# Patient Record
Sex: Male | Born: 1965 | Race: Black or African American | Hispanic: No | Marital: Married | Smoking: Never smoker
Health system: Southern US, Community
[De-identification: ages and names within clinical notes are randomized; demographics above are authoritative.]

## PROBLEM LIST (undated history)

## (undated) DIAGNOSIS — J45909 Unspecified asthma, uncomplicated: Secondary | ICD-10-CM

## (undated) DIAGNOSIS — Z800 Family history of malignant neoplasm of digestive organs: Secondary | ICD-10-CM

## (undated) DIAGNOSIS — M5432 Sciatica, left side: Secondary | ICD-10-CM

## (undated) DIAGNOSIS — K219 Gastro-esophageal reflux disease without esophagitis: Secondary | ICD-10-CM

## (undated) HISTORY — DX: Gastro-esophageal reflux disease without esophagitis: K21.9

## (undated) HISTORY — DX: Unspecified asthma, uncomplicated: J45.909

## (undated) HISTORY — DX: Family history of malignant neoplasm of digestive organs: Z80.0

---

## 2017-05-07 ENCOUNTER — Emergency Department: Payer: BLUE CROSS/BLUE SHIELD

## 2017-05-07 ENCOUNTER — Encounter: Payer: Self-pay | Admitting: Emergency Medicine

## 2017-05-07 ENCOUNTER — Emergency Department
Admission: EM | Admit: 2017-05-07 | Discharge: 2017-05-07 | Disposition: A | Discharge status: Home / Self Care | Payer: BLUE CROSS/BLUE SHIELD | Attending: Emergency Medicine | Admitting: Emergency Medicine

## 2017-05-07 DIAGNOSIS — R110 Nausea: Secondary | ICD-10-CM | POA: Insufficient documentation

## 2017-05-07 DIAGNOSIS — M542 Cervicalgia: Secondary | ICD-10-CM | POA: Insufficient documentation

## 2017-05-07 DIAGNOSIS — R51 Headache: Secondary | ICD-10-CM | POA: Diagnosis present

## 2017-05-07 DIAGNOSIS — G44209 Tension-type headache, unspecified, not intractable: Secondary | ICD-10-CM | POA: Diagnosis not present

## 2017-05-07 LAB — COMPREHENSIVE METABOLIC PANEL
ALT: 12 U/L — ABNORMAL LOW (ref 17–63)
AST: 17 U/L (ref 15–41)
Albumin: 3.6 g/dL (ref 3.5–5.0)
Alkaline Phosphatase: 54 U/L (ref 38–126)
Anion gap: 6 (ref 5–15)
BUN: 8 mg/dL (ref 6–20)
CO2: 26 mmol/L (ref 22–32)
Calcium: 8.6 mg/dL — ABNORMAL LOW (ref 8.9–10.3)
Chloride: 104 mmol/L (ref 101–111)
Creatinine, Ser: 0.79 mg/dL (ref 0.61–1.24)
GFR calc Af Amer: >60 mL/min (ref >60)
GFR calc non Af Amer: >60 mL/min (ref >60)
Glucose, Bld: 115 mg/dL — ABNORMAL HIGH (ref 65–99)
Potassium: 3.9 mmol/L (ref 3.5–5.1)
Sodium: 136 mmol/L (ref 135–145)
Total Bilirubin: 0.9 mg/dL (ref 0.3–1.2)
Total Protein: 7.9 g/dL (ref 6.5–8.1)

## 2017-05-07 LAB — URINALYSIS, COMPLETE (UACMP) WITH MICROSCOPIC
Bacteria, UA: NONE SEEN
Bilirubin Urine: NEGATIVE
Glucose, UA: NEGATIVE mg/dL
Hgb urine dipstick: NEGATIVE
Ketones, ur: NEGATIVE mg/dL
Leukocytes, UA: NEGATIVE
Nitrite: NEGATIVE
Protein, ur: NEGATIVE mg/dL
Specific Gravity, Urine: 1.019 (ref 1.005–1.030)
Squamous Epithelial / LPF: NONE SEEN
pH: 6 (ref 5.0–8.0)

## 2017-05-07 LAB — CBC WITH DIFFERENTIAL/PLATELET
Basophils Absolute: 0 10*3/uL (ref 0–0.1)
Basophils Relative: 1 %
Eosinophils Absolute: 0 10*3/uL (ref 0–0.7)
Eosinophils Relative: 0 %
HCT: 39.5 % — ABNORMAL LOW (ref 40.0–52.0)
Hemoglobin: 13.3 g/dL (ref 13.0–18.0)
Lymphocytes Relative: 34 %
Lymphs Abs: 1.9 10*3/uL (ref 1.0–3.6)
MCH: 29.9 pg (ref 26.0–34.0)
MCHC: 33.6 g/dL (ref 32.0–36.0)
MCV: 89.1 fL (ref 80.0–100.0)
Monocytes Absolute: 0.4 10*3/uL (ref 0.2–1.0)
Monocytes Relative: 7 %
Neutro Abs: 3.2 10*3/uL (ref 1.4–6.5)
Neutrophils Relative %: 58 %
Platelets: 191 10*3/uL (ref 150–440)
RBC: 4.43 MIL/uL (ref 4.40–5.90)
RDW: 13.6 % (ref 11.5–14.5)
WBC: 5.6 10*3/uL (ref 3.8–10.6)

## 2017-05-07 LAB — TROPONIN I: Troponin I: <0.03 ng/mL (ref <0.03)

## 2017-05-07 MED ORDER — SODIUM CHLORIDE 0.9 % IV BOLUS (SEPSIS): 1000.0000 mL | Freq: Once | INTRAVENOUS | Status: DC

## 2017-05-07 MED ORDER — LORAZEPAM 2 MG/ML IJ SOLN
1.0000 mg | Freq: Once | INTRAMUSCULAR | Status: AC
  Administered 2017-05-07: 1 mg via INTRAMUSCULAR

## 2017-05-07 MED ORDER — PROMETHAZINE HCL 25 MG/ML IJ SOLN
25.0000 mg | INTRAMUSCULAR | Status: AC
  Administered 2017-05-07: 25 mg via INTRAMUSCULAR
  Filled 2017-05-07: qty 1

## 2017-05-07 MED ORDER — LORAZEPAM 2 MG/ML IJ SOLN
INTRAMUSCULAR | Status: AC
  Administered 2017-05-07: 1 mg via INTRAMUSCULAR
  Filled 2017-05-07: qty 1

## 2017-05-07 MED ORDER — CYCLOBENZAPRINE HCL 10 MG PO TABS: 10.0000 mg | ORAL_TABLET | Freq: Three times a day (TID) | ORAL | 0 refills | Status: DC | PRN

## 2017-05-07 MED ORDER — DEXAMETHASONE 10 MG/ML FOR PEDIATRIC ORAL USE
8.0000 mg | Freq: Once | INTRAMUSCULAR | Status: AC
  Administered 2017-05-07: 8 mg via ORAL
  Filled 2017-05-07: qty 0.8

## 2017-05-07 MED ORDER — DEXAMETHASONE SODIUM PHOSPHATE 10 MG/ML IJ SOLN
8.0000 mg | Freq: Once | INTRAMUSCULAR | Status: DC
  Filled 2017-05-07: qty 1

## 2017-05-07 MED ORDER — PROMETHAZINE HCL 25 MG PO TABS: 25.0000 mg | ORAL_TABLET | Freq: Three times a day (TID) | ORAL | 0 refills | Status: DC | PRN

## 2017-05-07 MED ORDER — METOCLOPRAMIDE HCL 5 MG/ML IJ SOLN
10.0000 mg | Freq: Once | INTRAMUSCULAR | Status: DC
  Filled 2017-05-07: qty 2

## 2017-05-07 MED ORDER — IOPAMIDOL (ISOVUE-370) INJECTION 76%: 75.0000 mL | Freq: Once | INTRAVENOUS | Status: DC | PRN

## 2017-05-07 MED ORDER — ACETAMINOPHEN 500 MG PO TABS
1000.0000 mg | ORAL_TABLET | ORAL | Status: AC
  Administered 2017-05-07: 1000 mg via ORAL
  Filled 2017-05-07: qty 2

## 2017-05-07 NOTE — ED Notes (Addendum)
FIRST NURSE NOTE: Pt arrived via POV with c/o headache neck pain and nausea since yesterday.  Pt offered wheelchair, but declined. Pt is alert and oriented at front desk. Ambulatory without difficulty.

## 2017-05-07 NOTE — ED Provider Notes (Signed)
[facility] Emergency Department Provider Note   ____________________________________________   First MD Initiated Contact with Patient 05/07/17 1123     (approximate)  I have reviewed the triage vital signs and the nursing notes.   HISTORY  Chief Complaint Headache and Neck Pain   HPI Krishav Mamone is a 51 y.o. male reports that on Thursday evening he began experiencing mild headache. Presents seriously slightly worse over the right side of the back of the head and frontal region. Not throbbing, but describes a pressure-like feeling.  Over the last 2 days and headache seems to be steadily worsening. He reports that is been taking Profen yesterday with minimal relief. He has not had any fevers, no chills, no body aches. Does not work with bright light. Not worse by loud noise. Not associated with any vision changes.  Yesterday and this morning and reports he is now having pain from the headache that seems to radiate down into the base of the right side of the skull and onto the right side of the neck. Reports he can move his head up and down without difficulty, but when he turns his head side to side. His pain in the muscles on the right side of the neck.  No trauma or injury. Does not smoke. No history of cerebral aneurysm in the family. No known sick contacts.  No chest pain. No trouble breathing. No numbness or weakness in the arms or legs. No facial droop. No trouble speaking.  History reviewed. No pertinent past medical history.  There are no active problems to display for this patient.   History reviewed. No pertinent surgical history.  Prior to Admission medications   Medication Sig Start Date End Date Taking? Authorizing Provider  OVER THE COUNTER MEDICATION    Yes [provider]  cyclobenzaprine (FLEXERIL) 10 MG tablet Take 1 tablet (10 mg total) by mouth 3 (three) times daily as needed for muscle spasms. 05/07/17   Sharyn Creamer, MD    promethazine (PHENERGAN) 25 MG tablet Take 1 tablet (25 mg total) by mouth every 8 (eight) hours as needed for nausea (headache). 05/07/17   Sharyn Creamer, MD    Allergies Patient has no known allergies.  No family history on file.  Social History Social History  Substance Use Topics  . Smoking status: Never Smoker  . Smokeless tobacco: Never Used  . Alcohol use No    Review of Systems Constitutional: No fever/chills Eyes: No visual changes. ENT: No sore throat. Cardiovascular: Denies chest pain. Respiratory: Denies shortness of breath. Gastrointestinal: No abdominal pain.  No nausea, no vomiting.  No diarrhea.  No constipation. Genitourinary: Negative for dysuria. Musculoskeletal: Negative for back pain. He did have back pain about a year ago and reports it was treated with a shot and is improved. Skin: Negative for rash. Neurological: Negative for  focal weakness or numbness.    ____________________________________________   PHYSICAL EXAM:  VITAL SIGNS: ED Triage Vitals  Enc Vitals Group     BP 05/07/17 0951 140/80     Pulse Rate 05/07/17 0951 94     Resp 05/07/17 0951 18     Temp 05/07/17 0951 99.5 F (37.5 C)     Temp Source 05/07/17 0951 Oral     SpO2 05/07/17 0951 94 %     Weight 05/07/17 0952 252 lb (114.3 kg)     Height 05/07/17 0952 5\' 8"  (1.727 m)     Head Circumference --  Peak Flow --      Pain Score 05/07/17 0951 5     Pain Loc --      Pain Edu? --      Excl. in GC? --     Constitutional: Alert and oriented. Well appearing and in no acute distress. Eyes: Conjunctivae are normal.No photophobia. Pupils midpoint and reactive. Head: Atraumatic. Nose: No congestion/rhinnorhea. Mouth/Throat: Mucous membranes are moist. Neck: No stridor.  No rigidity. On palpation patient reports tenderness to the right posterior paraspinous muscles in the cervical spine. No mass. No tenderness along the left. When he flexes and extends the neck he reports no pain,  but when he turns from the head side side he does report pain pointing at the base of the right side of the occiput. Cardiovascular: Normal rate, regular rhythm. Grossly normal heart sounds.  Good peripheral circulation. Respiratory: Normal respiratory effort.  No retractions. Lungs CTAB. Gastrointestinal: Soft and nontender. No distention. Musculoskeletal: No lower extremity tenderness nor edema. Neurologic:  Normal speech and language. No gross focal neurologic deficits are appreciated. Moves all extremities with normal strength. Normal gait. No facial droop. Normal cranial nerve exam. 5 out of 5 strength in all extremities. No pronator drift. No ataxia. No temporal artery tenderness. There is normal pulsations of the temporal arteries bilateral.  Skin:  Skin is warm, dry and intact. No rash noted. Psychiatric: Mood and affect are normal. Speech and behavior are normal.  ____________________________________________   LABS (all labs ordered are listed, but only abnormal results are displayed)  Labs Reviewed  COMPREHENSIVE METABOLIC PANEL - Abnormal; Notable for the following:       Result Value   Glucose, Bld 115 (*)    Calcium 8.6 (*)    ALT 12 (*)    All other components within normal limits  CBC WITH DIFFERENTIAL/PLATELET - Abnormal; Notable for the following:    HCT 39.5 (*)    All other components within normal limits  URINALYSIS, COMPLETE (UACMP) WITH MICROSCOPIC - Abnormal; Notable for the following:    Color, Urine YELLOW (*)    APPearance CLEAR (*)    All other components within normal limits  TROPONIN I   ____________________________________________  EKG  Reviewed and interpreted by me at 1235 Heart rate 80 QRS 90 QTc 470 Normal sinus rhythm, possible left ventricular hypertrophy, no evidence of acute ischemic changes noted ____________________________________________  RADIOLOGY  Mr Angiogram Head Wo Contrast  Result Date: 05/07/2017 CLINICAL DATA:  51 year old  male with headache, neck pain and nausea since yesterday. EXAM: MRI HEAD WITHOUT CONTRAST MRA HEAD WITHOUT CONTRAST TECHNIQUE: Multiplanar, multiecho pulse sequences of the brain and surrounding structures were obtained without intravenous contrast. Angiographic images of the head were obtained using MRA technique without contrast. COMPARISON:  None. FINDINGS: MRI HEAD FINDINGS Brain: Cerebral volume is within normal limits. No restricted diffusion to suggest acute infarction. No midline shift, mass effect, evidence of mass lesion, ventriculomegaly, extra-axial collection or acute intracranial hemorrhage. Cervicomedullary junction and pituitary are within normal limits. Wallace CullensGray and white matter signal is normal for age throughout the brain. No encephalomalacia or chronic cerebral blood products identified. Vascular: Major intracranial vascular flow voids are preserved. Skull and upper cervical spine: Negative. Visualized bone marrow signal is within normal limits. Sinuses/Orbits: Normal orbits soft tissues. Mild paranasal sinus mucosal thickening, with trace bubbly opacity in the right sphenoid sinus and small left alveolar recess mucous retention cyst. Other: Visible internal auditory structures appear normal. Mastoids are clear except for trace fluid on  the left, significance doubtful. Negative nasopharynx. Scalp and face soft tissues appear negative. MRA HEAD FINDINGS Antegrade flow in the posterior circulation with codominant but somewhat diminutive distal vertebral arteries. No definite distal vertebral stenosis. Both PICA origins appear patent. The vertebrobasilar junction is patent; signal loss at the vertebrobasilar junction appears to be artifactual due to its relatively horizontal course. No basilar stenosis. AICA and SCA origins are patent. Fetal type right PCA origins, more so the right. Bilateral PCA branches are within normal limits. Antegrade flow in both ICA siphons. No siphon stenosis. Normal  ophthalmic and posterior communicating artery origins. Patent carotid termini. Normal MCA and ACA origins. Anterior communicating artery and visible ACA branches are normal. Early right MCA bifurcation. Visible bilateral MCA branches are within normal limits. IMPRESSION: 1. Normal noncontrast MRI appearance of the brain. 2. Negative intracranial MRA. Diminutive vertebrobasilar system on the basis of fetal type PCA origins (normal variant). Electronically Signed   By: Odessa Fleming M.D.   On: 05/07/2017 15:53   Mr Brain Wo Contrast  Result Date: 05/07/2017 CLINICAL DATA:  51 year old male with headache, neck pain and nausea since yesterday. EXAM: MRI HEAD WITHOUT CONTRAST MRA HEAD WITHOUT CONTRAST TECHNIQUE: Multiplanar, multiecho pulse sequences of the brain and surrounding structures were obtained without intravenous contrast. Angiographic images of the head were obtained using MRA technique without contrast. COMPARISON:  None. FINDINGS: MRI HEAD FINDINGS Brain: Cerebral volume is within normal limits. No restricted diffusion to suggest acute infarction. No midline shift, mass effect, evidence of mass lesion, ventriculomegaly, extra-axial collection or acute intracranial hemorrhage. Cervicomedullary junction and pituitary are within normal limits. Wallace Cullens and white matter signal is normal for age throughout the brain. No encephalomalacia or chronic cerebral blood products identified. Vascular: Major intracranial vascular flow voids are preserved. Skull and upper cervical spine: Negative. Visualized bone marrow signal is within normal limits. Sinuses/Orbits: Normal orbits soft tissues. Mild paranasal sinus mucosal thickening, with trace bubbly opacity in the right sphenoid sinus and small left alveolar recess mucous retention cyst. Other: Visible internal auditory structures appear normal. Mastoids are clear except for trace fluid on the left, significance doubtful. Negative nasopharynx. Scalp and face soft tissues appear  negative. MRA HEAD FINDINGS Antegrade flow in the posterior circulation with codominant but somewhat diminutive distal vertebral arteries. No definite distal vertebral stenosis. Both PICA origins appear patent. The vertebrobasilar junction is patent; signal loss at the vertebrobasilar junction appears to be artifactual due to its relatively horizontal course. No basilar stenosis. AICA and SCA origins are patent. Fetal type right PCA origins, more so the right. Bilateral PCA branches are within normal limits. Antegrade flow in both ICA siphons. No siphon stenosis. Normal ophthalmic and posterior communicating artery origins. Patent carotid termini. Normal MCA and ACA origins. Anterior communicating artery and visible ACA branches are normal. Early right MCA bifurcation. Visible bilateral MCA branches are within normal limits. IMPRESSION: 1. Normal noncontrast MRI appearance of the brain. 2. Negative intracranial MRA. Diminutive vertebrobasilar system on the basis of fetal type PCA origins (normal variant). Electronically Signed   By: Odessa Fleming M.D.   On: 05/07/2017 15:53    ____________________________________________   PROCEDURES  Procedure(s) performed: None  Procedures  Critical Care performed: No  ____________________________________________   INITIAL IMPRESSION / ASSESSMENT AND PLAN / ED COURSE  Pertinent labs & imaging results that were available during my care of the patient were reviewed by me and considered in my medical decision making (see chart for details).  Differential diagnosis includes but is  not exclusive to subarachnoid hemorrhage, meningitis, encephalitis, previous head trauma, cavernous venous thrombosis, muscle tension headache, temporal arteritis, migraine or migraine equivalent, etc. he is afebrile and reports no infectious symptoms. No photophobia. Symptoms are of a steadily worsening pressure-like headache. He has focal discomfort and tenderness in the region of the right  paraspinous muscles of the cervical spine. I do not see any clinical evidence support meningitis or encephalitis. No history of trauma. He takes no medications. No family history of subarachnoid hemorrhage and his symptoms are not that of a thunderclap headache.  Based on his symptomatology we'll proceed with headache workup including CT imaging   Clinical Course as of May 07 1740  Wynelle LinkSun May 07, 2017  1312 Clinical case discussed with Dr. Loretha BrasilZeylikman. He advises symptomatology likely of that of a tension type headache given the associated occipital pain at the right base. Right paraspinous discomfort. I agree, does seem to be likely a tension type headache but given the patient's minimal previous history of headaches which to obtain imaging. Discussed with the patient and we'll proceed with MRI as we were unable to obtain IV access for CT angiogram. Will use MRA without contrast to further evaluate.  [MQ]    Clinical Course User Index [MQ] Sharyn CreamerQuale, Mark, MD    ----------------------------------------- 5:41 PM on 05/07/2017 -----------------------------------------  Patient reports he feels better. Ambulating and with no distress. Reports his headache is essentially gone now. He is awake alert. States he feels much better. Carefully reviewed return precautions including any worsening headache, numbness or weakness or development of any fever or confusion.    Return precautions and treatment recommendations and follow-up discussed with the patient who is agreeable with the plan.  ____________________________________________   FINAL CLINICAL IMPRESSION(S) / ED DIAGNOSES  Final diagnoses:  Acute non intractable tension-type headache      NEW MEDICATIONS STARTED DURING THIS VISIT:  New Prescriptions   CYCLOBENZAPRINE (FLEXERIL) 10 MG TABLET    Take 1 tablet (10 mg total) by mouth 3 (three) times daily as needed for muscle spasms.   PROMETHAZINE (PHENERGAN) 25 MG TABLET    Take 1 tablet (25  mg total) by mouth every 8 (eight) hours as needed for nausea (headache).     Note:  This document was prepared using Dragon voice recognition software and may include unintentional dictation errors.     Sharyn CreamerQuale, Mark, MD 05/07/17 [phone]

## 2017-05-07 NOTE — ED Triage Notes (Signed)
Patient presents the ED with headache since Friday and neck pain that began today.  Patient can bend his chin down to his chest but is having pain with moving his head from side to side.  Patient denies history of headaches.  Denies body aches, chills, and fever at home.  Patient denies any major medical history.

## 2017-05-07 NOTE — ED Notes (Signed)

## 2017-05-07 NOTE — Discharge Instructions (Signed)
You have been seen in the Emergency Department (ED) for a headache.  Please use Tylenol or Motrin as needed for symptoms, but only as written on the box.   As we have discussed, please follow up with your primary care doctor as soon as possible regarding today?s Emergency Department (ED) visit and your headache symptoms  Do not drive this evening or after taking flexeril or phenergan, as you may get drowsy from the medicine.  Return to the ED if you have a worsening headache, fever, sudden and severe headache, confusion, slurred speech, facial droop, weakness or numbness in any arm or leg, extreme fatigue, vision problems, or other symptoms that concern you.

## 2017-05-07 NOTE — ED Notes (Signed)
Received call from MRI tech that pt having difficulty tolerating procedure d/t anxiety. Spoke with Quale MD who ordered x1 1MG  Ativan IM.

## 2018-05-09 ENCOUNTER — Encounter: Payer: Self-pay | Admitting: Emergency Medicine

## 2018-05-09 ENCOUNTER — Other Ambulatory Visit: Payer: Self-pay

## 2018-05-09 ENCOUNTER — Emergency Department
Admission: EM | Admit: 2018-05-09 | Discharge: 2018-05-09 | Disposition: A | Discharge status: Home / Self Care | Payer: BLUE CROSS/BLUE SHIELD | Attending: Emergency Medicine | Admitting: Emergency Medicine

## 2018-05-09 DIAGNOSIS — Z79899 Other long term (current) drug therapy: Secondary | ICD-10-CM | POA: Insufficient documentation

## 2018-05-09 DIAGNOSIS — M545 Low back pain: Secondary | ICD-10-CM | POA: Diagnosis present

## 2018-05-09 DIAGNOSIS — M5442 Lumbago with sciatica, left side: Secondary | ICD-10-CM

## 2018-05-09 HISTORY — DX: Sciatica, left side: M54.32

## 2018-05-09 MED ORDER — PREDNISONE 10 MG PO TABS: ORAL_TABLET | ORAL | 0 refills | Status: DC

## 2018-05-09 MED ORDER — CYCLOBENZAPRINE HCL 5 MG PO TABS: 5.0000 mg | ORAL_TABLET | Freq: Three times a day (TID) | ORAL | 0 refills | Status: DC | PRN

## 2018-05-09 NOTE — Discharge Instructions (Signed)
You have been seen in the Emergency Department (ED)  today for back pain.  Your workup and exam have not shown any acute abnormalities and you are likely suffering from muscle strain or possible problems with your discs, but there is no treatment that will fix your symptoms at this time.  Please follow up with your doctor as soon as possible regarding today's ED visit and your back pain.    Try using the provided prescription for prednisone which may help over an extended period of time.  We also provided a muscle relaxer for you would keep in mind that this medication may make you sleepy and you should not drive or operate machinery while taking it.  You can use over-the-counter Tylenol 1000 mg by mouth up to 4 times a day, and once you are completely done with the prednisone, you can try taking ibuprofen 600 mg 3 times a day with meals.  Return to the ED for worsening back pain, fever, weakness or numbness of either leg, or if you develop either (1) an inability to urinate or have bowel movements, or (2) loss of your ability to control your bathroom functions (if you start having "accidents"), or if you develop other new symptoms that concern you.

## 2018-05-09 NOTE — ED Provider Notes (Signed)
[facility] Emergency Department Provider Note  ____________________________________________   First MD Initiated Contact with Patient 05/09/18 [phone]     (approximate)  I have reviewed the triage vital signs and the nursing notes.   HISTORY  Chief Complaint Back Pain    HPI Brett Guerrero is a 52 y.o. male with no significant medical history except for some prior sciatica who presents for evaluation of gradually worsening left-sided back pain radiating down into his left lower extremity. The symptoms started about 3 days ago and have been persistent.  They are worse with ambulation and weightbearing and better at rest.  He had no specific injury or trauma for which she is aware.  He says it feels like it did the last time but extends further down into his leg.  It starts on the left side of his lower back and goes through the buttocks and into his upper thigh.  There is no weakness, just a sharp and aching pain.  The pain does not extend down beyond the thigh.  He denies fever/chills, chest pain, shortness of breath, nausea, vomiting, and abdominal pain.  He is having no difficulty urinating and no difficulty emptying his bladder.   Past Medical History:  Diagnosis Date  . Sciatica of left side     There are no active problems to display for this patient.   History reviewed. No pertinent surgical history.  Prior to Admission medications   Medication Sig Start Date End Date Taking? Authorizing Provider  cyclobenzaprine (FLEXERIL) 5 MG tablet Take 1 tablet (5 mg total) by mouth 3 (three) times daily as needed for muscle spasms. 05/09/18   Loleta Rose, MD  OVER THE COUNTER MEDICATION     [provider]  predniSONE (DELTASONE) 10 MG tablet Take 6 tabs (60 mg) PO x 3 days, then take 4 tabs (40 mg) PO x 3 days, then take 2 tabs (20 mg) PO x 3 days, then take 1 tab (10 mg) PO x 3 days, then take 1/2 tab (5 mg) PO x 4 days. 05/09/18   Loleta Rose, MD    promethazine (PHENERGAN) 25 MG tablet Take 1 tablet (25 mg total) by mouth every 8 (eight) hours as needed for nausea (headache). 05/07/17   Sharyn Creamer, MD    Allergies Patient has no known allergies.  History reviewed. No pertinent family history.  Social History Social History   Tobacco Use  . Smoking status: Never Smoker  . Smokeless tobacco: Never Used  Substance Use Topics  . Alcohol use: No  . Drug use: No    Review of Systems Constitutional: No fever/chills Cardiovascular: Denies chest pain. Respiratory: Denies shortness of breath. Genitourinary: Negative for dysuria. Musculoskeletal: Left-sided low back pain radiating into buttocks and thigh Integumentary: Negative for rash. Neurological: Negative for headaches, focal weakness or numbness. Pain in LLE   ____________________________________________   PHYSICAL EXAM:  VITAL SIGNS: ED Triage Vitals  Enc Vitals Group     BP 05/09/18 0449 125/82     Pulse Rate 05/09/18 0449 79     Resp 05/09/18 0449 18     Temp 05/09/18 0449 98.4 F (36.9 C)     Temp Source 05/09/18 0449 Oral     SpO2 05/09/18 0449 97 %     Weight 05/09/18 0450 108.9 kg (240 lb)     Height 05/09/18 0450 1.727 m (5\' 8" )     Head Circumference --      Peak Flow --  Pain Score 05/09/18 0450 0     Pain Loc --      Pain Edu? --      Excl. in GC? --     Constitutional: Alert and oriented. Well appearing and in no acute distress until he tries to move. Cardiovascular: Normal rate, regular rhythm. Good peripheral circulation. Respiratory: Normal respiratory effort.  No retractions.  Musculoskeletal: Tenderness to palpation of the soft tissue of the left lower back but without any fluctuance, induration, or evidence of cellulitis.  No step-offs or deformities or tenderness to the L-spine.  No lower extremity tenderness nor edema. No gross deformities of extremities. Neurologic:  Normal speech and language. No gross focal neurologic deficits are  appreciated.  The patient is able to ambulate but with a limp due to the pain. Skin:  Skin is warm, dry and intact. No rash noted. Psychiatric: Mood and affect are normal. Speech and behavior are normal.  ____________________________________________   LABS (all labs ordered are listed, but only abnormal results are displayed)  Labs Reviewed - No data to display ____________________________________________  EKG  No indication for EKG ____________________________________________  RADIOLOGY   ED MD interpretation: No indication for imaging  Official radiology report(s): No results found.  ____________________________________________   PROCEDURES  Critical Care performed: No   Procedure(s) performed:   Procedures   ____________________________________________   INITIAL IMPRESSION / ASSESSMENT AND PLAN / ED COURSE  As part of my medical decision making, I reviewed the following data within the electronic MEDICAL RECORD NUMBER Nursing notes reviewed and incorporated    No signs or symptoms of an emergent cause of back pain such as cauda equina syndrome, epidural abscess, osteomyelitis/discitis, transverse myelitis, etc.  The patient has suffered from back pain in the past with sciatica and says that his symptoms were resolved after a "shot of something" that he thinks was a muscle relaxer but then he remembered it was a steroid.  Because steroids seem to work from the past I have prescribed a prednisone taper as well as Flexeril since he is obviously in a great deal of discomfort.  I encouraged him to not use ibuprofen as long as he is on the prednisone because I do not want to lead to gastric issues.  I gave him the name and number of Dr. Yves Dillhasnis with whom he can follow-up because I think he may benefit from his expertise and possibly some injections.  I gave my usual customary low back pain return precautions and he understands and agrees with the plan.      ____________________________________________  FINAL CLINICAL IMPRESSION(S) / ED DIAGNOSES  Final diagnoses:  Acute left-sided low back pain with left-sided sciatica     MEDICATIONS GIVEN DURING THIS VISIT:  Medications - No data to display   ED Discharge Orders        Ordered    predniSONE (DELTASONE) 10 MG tablet     05/09/18 0627    cyclobenzaprine (FLEXERIL) 5 MG tablet  3 times daily PRN     05/09/18 [id number]       Note:  This document was prepared using Dragon voice recognition software and may include unintentional dictation errors.    Loleta RoseForbach, Leylany Nored, MD 05/09/18 [phone]

## 2018-05-09 NOTE — ED Triage Notes (Signed)
Pt presents ambulatory to triage with left lower back / hip pain that radiates around to the front of his left thigh. Pt states he has had similar symptoms previously and was treated with a muscle relaxer. Pain has been ongoing for the past 3 days.

## 2018-06-10 IMAGING — MR MR MRA HEAD W/O CM
11 series · 41 of 48 positions shown · non-contrast
Comparison: None.

CLINICAL DATA: 50-year-old male with headache, neck pain and nausea
since yesterday.

EXAM:
MRI HEAD WITHOUT CONTRAST
MRA HEAD WITHOUT CONTRAST
TECHNIQUE: Multiplanar, multiecho pulse sequences of the brain and surrounding
structures were obtained without intravenous contrast. Angiographic
images of the head were obtained using MRA technique without
contrast.

[Series 3: DWI · axial · 3.0mm · 1.80mm/px · z∈[-77,+85]mm · 5 of 55 slices shown (1 of 2)]
[im 1/55]
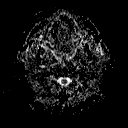
[im 14/55]
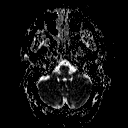
[im 28/55]
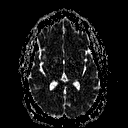
[im 41/55]
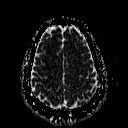
[im 55/55]
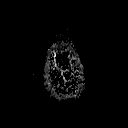

[Series 5: DWI · coronal · 3.0mm · 1.80mm/px · 5 of 51 slices shown (2 of 2)]
[im 1/51]
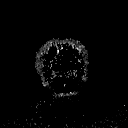
[im 13/51]
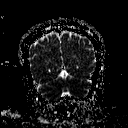
[im 26/51]
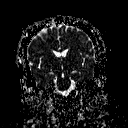
[im 38/51]
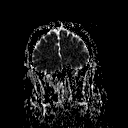
[im 51/51]
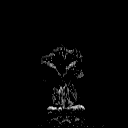

[Series 7: TOF · axial · non-contrast · 0.7mm · 0.37mm/px · z∈[-54,+49]mm · 8 of 150 slices shown]
[im 1/150]
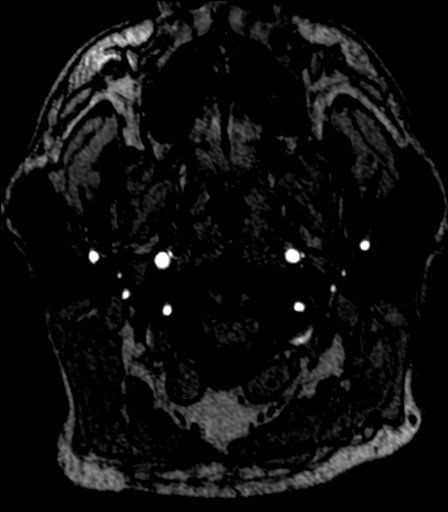
[im 23/150]
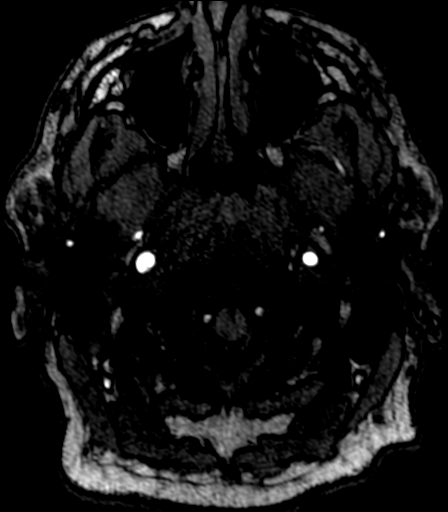
[im 46/150]
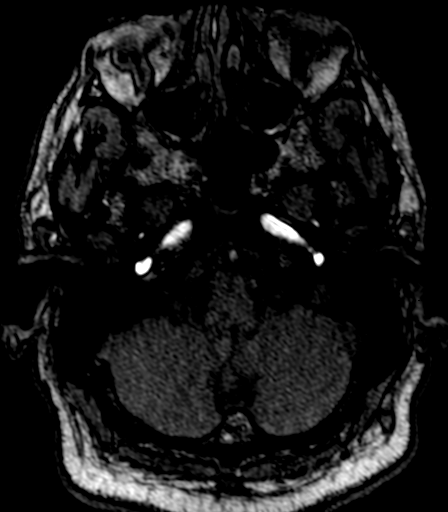
[im 69/150]
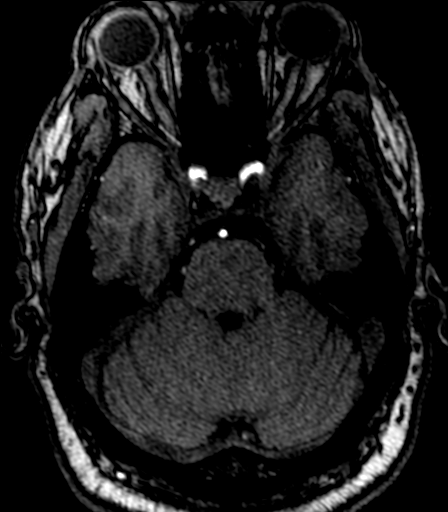
[im 81/150]
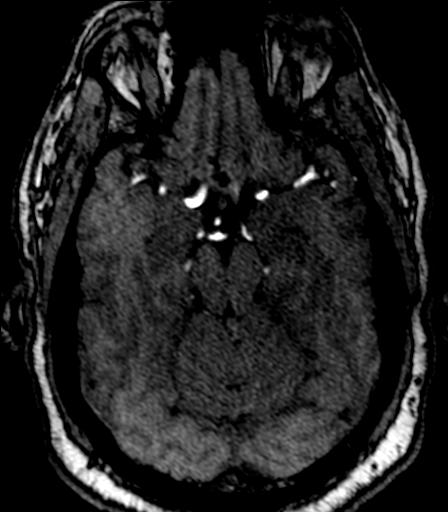
[im 104/150]
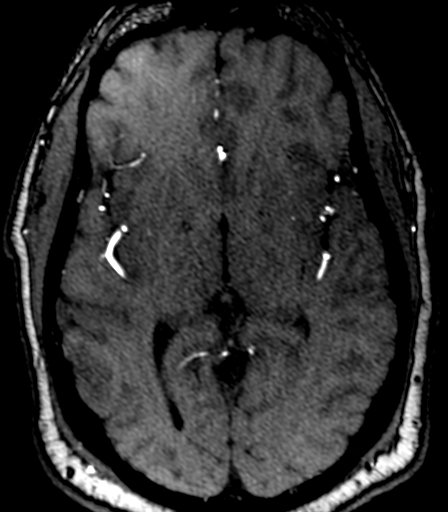
[im 127/150]
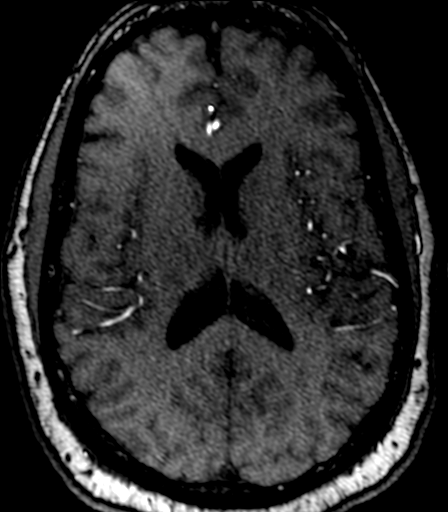
[im 150/150]
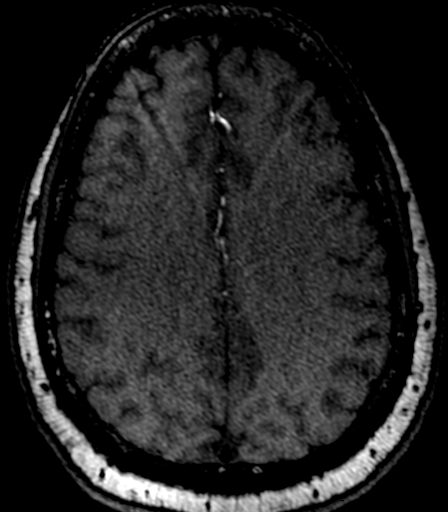

[Series 11: FLAIR · axial · 5.0mm · 0.45mm/px · z∈[-78,+90]mm · 2 of 27 slices shown]
[im 1/27]
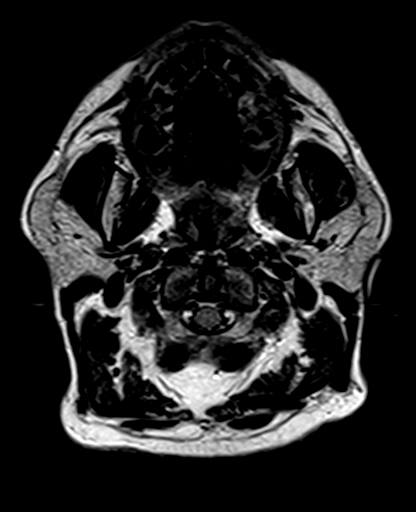
[im 27/27]
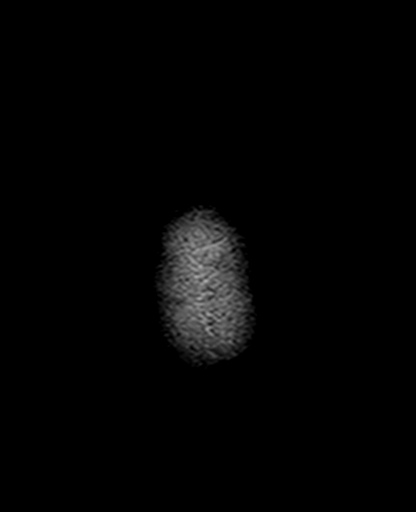

[Series 13: T2 · axial · 5.0mm · 0.45mm/px · z∈[-78,+90]mm · 2 of 27 slices shown (1 of 3)]
[im 1/27]
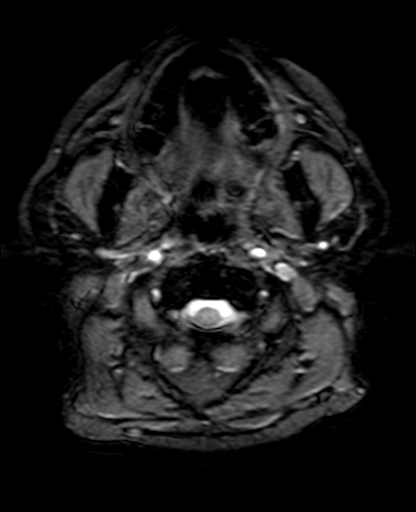
[im 27/27]
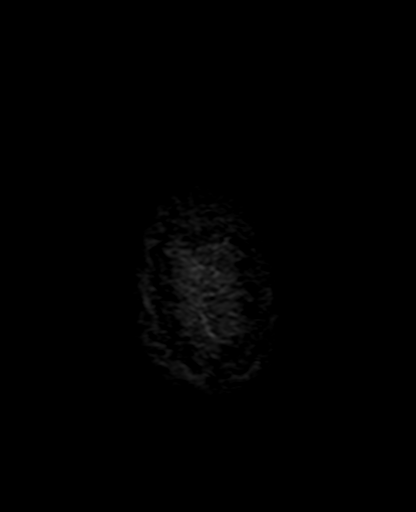

[Series 14: T2 · axial · 5.0mm · 0.90mm/px · z∈[-78,+90]mm · 2 of 27 slices shown (2 of 3)]
[im 1/27]
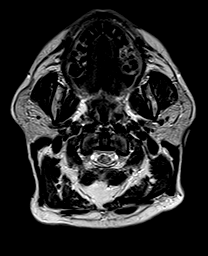
[im 27/27]
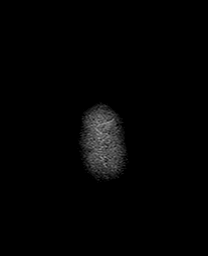

[Series 15: T1 · sagittal · 5.0mm · 0.47mm/px · 3 of 29 slices shown]
[im 1/29]
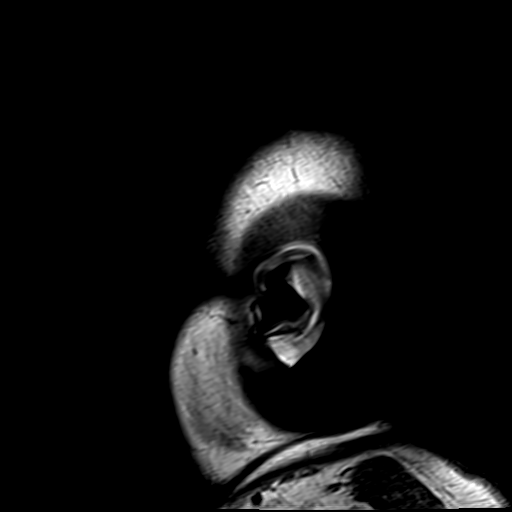
[im 15/29]
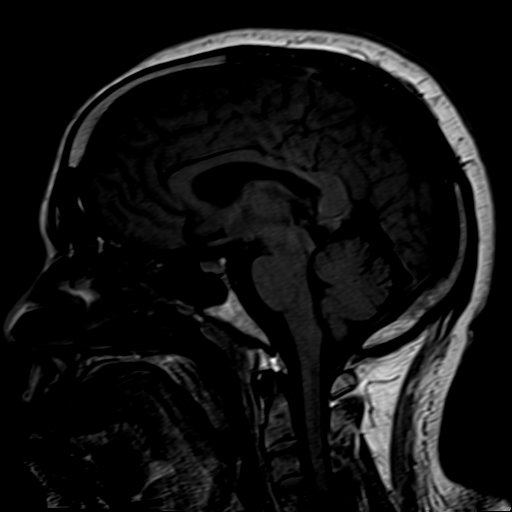
[im 29/29]
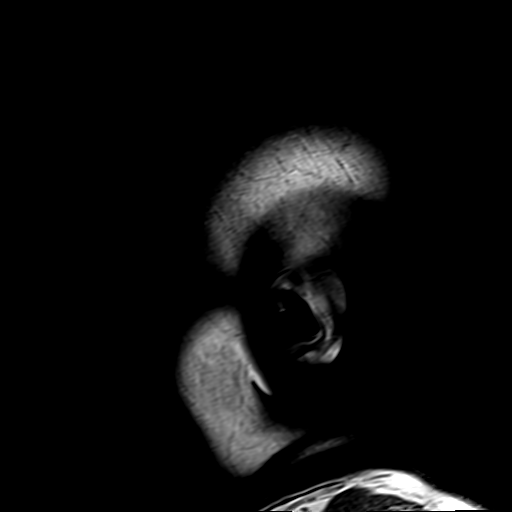

[Series 16: GRE · axial · 5.0mm · 0.45mm/px · z∈[-80,+88]mm · 2 of 27 slices shown]
[im 1/27]
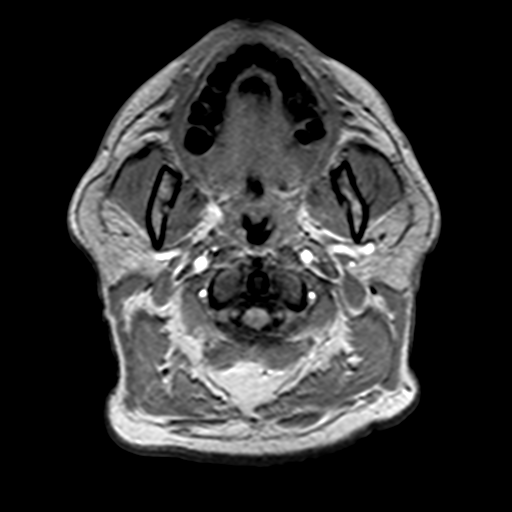
[im 27/27]
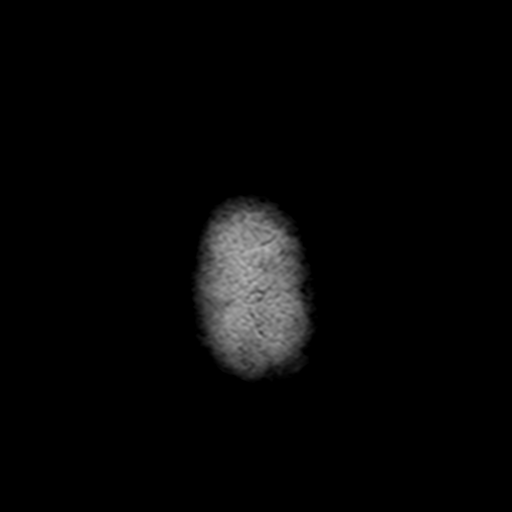

[Series 17: T2 · coronal · 5.0mm · 0.86mm/px · 3 of 31 slices shown (3 of 3)]
[im 1/31]
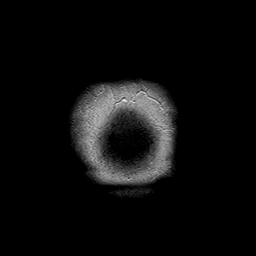
[im 16/31]
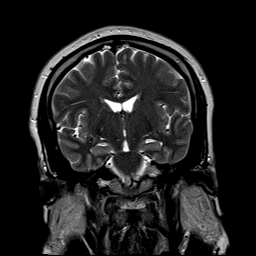
[im 31/31]
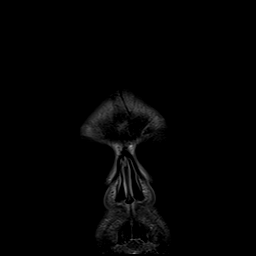

[Series 100: ax (id) · axial · 3.0mm · 1.80mm/px · z∈[-77,+85]mm · 5 of 55 slices shown]
[im 1/55]
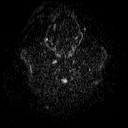
[im 14/55]
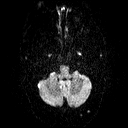
[im 28/55]
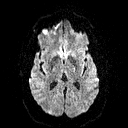
[im 41/55]
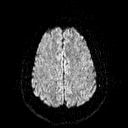
[im 55/55]
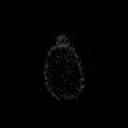

[Series 101: cor (id) · coronal · 3.0mm · 1.80mm/px · 4 of 50 slices shown]
[im 1/50]
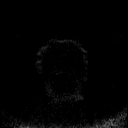
[im 13/50]
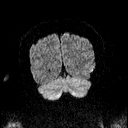
[im 25/50]
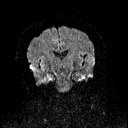
[im 37/50]
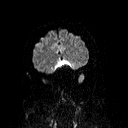

[41 of 48 positions shown; findings below may reference images not displayed]

FINDINGS: MRI HEAD FINDINGS

Brain: Cerebral volume is within normal limits. No restricted
diffusion to suggest acute infarction. No midline shift, mass
effect, evidence of mass lesion, ventriculomegaly, extra-axial
collection or acute intracranial hemorrhage. Cervicomedullary
junction and pituitary are within normal limits.

Gray and white matter signal is normal for age throughout the brain.
No encephalomalacia or chronic cerebral blood products identified.

Vascular: Major intracranial vascular flow voids are preserved.

Skull and upper cervical spine: Negative. Visualized bone marrow
signal is within normal limits.

Sinuses/Orbits: Normal orbits soft tissues. Mild paranasal sinus
mucosal thickening, with trace bubbly opacity in the right sphenoid
sinus and small left alveolar recess mucous retention cyst.

Other: Visible internal auditory structures appear normal. Mastoids
are clear except for trace fluid on the left, significance doubtful.
Negative nasopharynx. Scalp and face soft tissues appear negative.

MRA HEAD FINDINGS

Antegrade flow in the posterior circulation with codominant but
somewhat diminutive distal vertebral arteries. No definite distal
vertebral stenosis. Both PICA origins appear patent. The
vertebrobasilar junction is patent; signal loss at the
vertebrobasilar junction appears to be artifactual due to its
relatively horizontal course. No basilar stenosis. AICA and SCA
origins are patent. Fetal type right PCA origins, more so the right.
Bilateral PCA branches are within normal limits.

Antegrade flow in both ICA siphons. No siphon stenosis. Normal
ophthalmic and posterior communicating artery origins. Patent
carotid termini. Normal MCA and ACA origins. Anterior communicating
artery and visible ACA branches are normal. Early right MCA
bifurcation. Visible bilateral MCA branches are within normal
limits.
IMPRESSION: 1. Normal noncontrast MRI appearance of the brain.
2. Negative intracranial MRA. Diminutive vertebrobasilar system on
the basis of fetal type PCA origins (normal variant).

## 2018-10-11 ENCOUNTER — Ambulatory Visit (INDEPENDENT_AMBULATORY_CARE_PROVIDER_SITE_OTHER): Payer: BLUE CROSS/BLUE SHIELD | Admitting: Emergency Medicine

## 2018-10-11 ENCOUNTER — Other Ambulatory Visit: Payer: Self-pay

## 2018-10-11 ENCOUNTER — Encounter: Payer: Self-pay | Admitting: Emergency Medicine

## 2018-10-11 VITALS — BP 118/67 | HR 102 | Temp 98.8°F | Resp 16 | Ht 65.5 in | Wt 285.8 lb

## 2018-10-11 DIAGNOSIS — Z130 Encounter for screening for diseases of the blood and blood-forming organs and certain disorders involving the immune mechanism: Secondary | ICD-10-CM

## 2018-10-11 DIAGNOSIS — Z13220 Encounter for screening for lipoid disorders: Secondary | ICD-10-CM

## 2018-10-11 DIAGNOSIS — Z1329 Encounter for screening for other suspected endocrine disorder: Secondary | ICD-10-CM | POA: Diagnosis not present

## 2018-10-11 DIAGNOSIS — Z13228 Encounter for screening for other metabolic disorders: Secondary | ICD-10-CM

## 2018-10-11 DIAGNOSIS — Z0000 Encounter for general adult medical examination without abnormal findings: Secondary | ICD-10-CM

## 2018-10-11 NOTE — Patient Instructions (Addendum)

## 2018-10-11 NOTE — Progress Notes (Signed)
Dalbert MayotteHugh Zbikowski 53 y.o.   Chief Complaint  Patient presents with  . Establish Care  . Tailbone Pain    per patient fell last year - pain on/off since    HISTORY OF PRESENT ILLNESS: This is a 53 y.o. male here for his annual exam.  First visit to this office.  Has no chronic medical problems.  No chronic medications.  Non-smoker.  Larey SeatFell about 1 year ago and injured tailbone area complaining of intermittent pain since.  No other significant symptoms. Up-to-date with colonoscopy. Family history remarkable for hypertension and diabetes. No other complaints or medical concerns today. Self medical grade: D due to weight issues.  No other concerns.  HPI   Prior to Admission medications   Medication Sig Start Date End Date Taking? Authorizing Provider  cyclobenzaprine (FLEXERIL) 5 MG tablet Take 1 tablet (5 mg total) by mouth 3 (three) times daily as needed for muscle spasms. Patient not taking: Reported on 10/11/2018 05/09/18   Loleta RoseForbach, Cory, MD  OVER THE COUNTER MEDICATION     [provider]  predniSONE (DELTASONE) 10 MG tablet Take 6 tabs (60 mg) PO x 3 days, then take 4 tabs (40 mg) PO x 3 days, then take 2 tabs (20 mg) PO x 3 days, then take 1 tab (10 mg) PO x 3 days, then take 1/2 tab (5 mg) PO x 4 days. Patient not taking: Reported on 10/11/2018 05/09/18   Loleta RoseForbach, Cory, MD  promethazine (PHENERGAN) 25 MG tablet Take 1 tablet (25 mg total) by mouth every 8 (eight) hours as needed for nausea (headache). Patient not taking: Reported on 10/11/2018 05/07/17   Sharyn CreamerQuale, Mark, MD    No Known Allergies  There are no active problems to display for this patient.   Past Medical History:  Diagnosis Date  . Sciatica of left side     No past surgical history on file.  Social History   Socioeconomic History  . Marital status: Married    Spouse name: Not on file  . Number of children: Not on file  . Years of education: Not on file  . Highest education level: Not on file  Occupational  History  . Not on file  Social Needs  . Financial resource strain: Not on file  . Food insecurity:    Worry: Not on file    Inability: Not on file  . Transportation needs:    Medical: Not on file    Non-medical: Not on file  Tobacco Use  . Smoking status: Never Smoker  . Smokeless tobacco: Never Used  Substance and Sexual Activity  . Alcohol use: No  . Drug use: No  . Sexual activity: Not on file  Lifestyle  . Physical activity:    Days per week: Not on file    Minutes per session: Not on file  . Stress: Not on file  Relationships  . Social connections:    Talks on phone: Not on file    Gets together: Not on file    Attends religious service: Not on file    Active member of club or organization: Not on file    Attends meetings of clubs or organizations: Not on file    Relationship status: Not on file  . Intimate partner violence:    Fear of current or ex partner: Not on file    Emotionally abused: Not on file    Physically abused: Not on file    Forced sexual activity: Not on file  Other Topics Concern  . Not on file  Social History Narrative  . Not on file    No family history on file.    Review of Systems  Constitutional: Negative.  Negative for chills, fever and weight loss.  HENT: Negative.  Negative for congestion, nosebleeds and sore throat.   Eyes: Negative.  Negative for blurred vision and double vision.  Respiratory: Negative.  Negative for cough and shortness of breath.   Cardiovascular: Negative.  Negative for chest pain and palpitations.  Gastrointestinal: Negative.  Negative for abdominal pain, blood in stool, diarrhea, melena, nausea and vomiting.  Genitourinary: Negative.  Negative for dysuria and hematuria.  Musculoskeletal: Negative.  Negative for back pain, myalgias and neck pain.  Skin: Negative.  Negative for rash.  Neurological: Negative.  Negative for dizziness and headaches.  Endo/Heme/Allergies: Negative.   All other systems reviewed and  are negative.  Vitals:   10/11/18 1350  BP: 118/67  Pulse: (!) 102  Resp: 16  Temp: 98.8 F (37.1 C)  SpO2: 96%     Physical Exam Vitals signs reviewed.  Constitutional:      Appearance: He is obese.  HENT:     Head: Normocephalic and atraumatic.     Nose: Nose normal.     Mouth/Throat:     Mouth: Mucous membranes are dry.  Eyes:     Extraocular Movements: Extraocular movements intact.     Conjunctiva/sclera: Conjunctivae normal.     Pupils: Pupils are equal, round, and reactive to light.  Neck:     Musculoskeletal: Normal range of motion and neck supple.  Cardiovascular:     Rate and Rhythm: Normal rate and regular rhythm.     Heart sounds: Normal heart sounds.  Pulmonary:     Effort: Pulmonary effort is normal.     Breath sounds: Normal breath sounds.  Abdominal:     General: Bowel sounds are normal.     Palpations: Abdomen is soft. There is no mass.     Tenderness: There is no abdominal tenderness.  Musculoskeletal: Normal range of motion.  Skin:    General: Skin is warm and dry.     Capillary Refill: Capillary refill takes less than 2 seconds.  Neurological:     General: No focal deficit present.     Mental Status: He is alert and oriented to person, place, and time.  Psychiatric:        Mood and Affect: Mood normal.        Behavior: Behavior normal.      ASSESSMENT & PLAN: Jena GaussHugh was seen today for establish care and tailbone pain.  Diagnoses and all orders for this visit:  Routine general medical examination at a health care facility -     Comprehensive metabolic panel  Screening for deficiency anemia -     CBC with Differential  Screening for lipoid disorders -     Lipid panel  Screening for endocrine, metabolic and immunity disorder -     Comprehensive metabolic panel -     Hemoglobin A1c -     Lipid panel -     TSH    Patient Instructions       If you have lab work done today you will be contacted with your lab results within the  next 2 weeks.  If you have not heard from us then please contact us. The fastest way to get your results is to register for My Chart.   IF you received an x-ray  today, you will receive an invoice from [facility]. Please contact [facility] at [phone] with questions or concerns regarding your invoice.   IF you received labwork today, you will receive an invoice from [facility]. Please contact [facility] at [phone] with questions or concerns regarding your invoice.   Our billing staff will not be able to assist you with questions regarding bills from these companies.  You will be contacted with the lab results as soon as they are available. The fastest way to get your results is to activate your My Chart account. Instructions are located on the last page of this paperwork. If you have not heard from [place] regarding the results in 2 weeks, please contact this office.      Health Maintenance, Male A healthy lifestyle and preventive care is important for your health and wellness. Ask your health care provider about what schedule of regular examinations is right for you. What should I know about weight and diet? Eat a Healthy Diet  Eat plenty of vegetables, fruits, whole grains, low-fat dairy products, and lean protein.  Do not eat a lot of foods high in solid fats, added sugars, or salt.  Maintain a Healthy Weight Regular exercise can help you achieve or maintain a healthy weight. You should:  Do at least 150 minutes of exercise each week. The exercise should increase your heart rate and make you sweat (moderate-intensity exercise).  Do strength-training exercises at least twice a week. Watch Your Levels of Cholesterol and Blood Lipids  Have your blood tested for lipids and cholesterol every 5 years starting at 52 years of age. If you are at high risk for heart disease, you should start having your blood tested when you are 52 years old. You may need to have your  cholesterol levels checked more often if: ? Your lipid or cholesterol levels are high. ? You are older than 52 years of age. ? You are at high risk for heart disease. What should I know about cancer screening? Many types of cancers can be detected early and may often be prevented. Lung Cancer  You should be screened every year for lung cancer if: ? You are a current smoker who has smoked for at least 30 years. ? You are a former smoker who has quit within the past 15 years.  Talk to your health care provider about your screening options, when you should start screening, and how often you should be screened. Colorectal Cancer  Routine colorectal cancer screening usually begins at 52 years of age and should be repeated every 5-10 years until you are 52 years old. You may need to be screened more often if early forms of precancerous polyps or small growths are found. Your health care provider may recommend screening at an earlier age if you have risk factors for colon cancer.  Your health care provider may recommend using home test kits to check for hidden blood in the stool.  A small camera at the end of a tube can be used to examine your colon (sigmoidoscopy or colonoscopy). This checks for the earliest forms of colorectal cancer. Prostate and Testicular Cancer  Depending on your age and overall health, your health care provider may do certain tests to screen for prostate and testicular cancer.  Talk to your health care provider about any symptoms or concerns you have about testicular or prostate cancer. Skin Cancer  Check your skin from head to toe regularly.  Tell your health care provider about any  new moles or changes in moles, especially if: ? There is a change in a mole's size, shape, or color. ? You have a mole that is larger than a pencil eraser.  Always use sunscreen. Apply sunscreen liberally and repeat throughout the day.  Protect yourself by wearing long sleeves, pants,  a wide-brimmed hat, and sunglasses when outside. What should I know about heart disease, diabetes, and high blood pressure?  If you are 102-86 years of age, have your blood pressure checked every 3-5 years. If you are 52 years of age or older, have your blood pressure checked every year. You should have your blood pressure measured twice-once when you are at a hospital or clinic, and once when you are not at a hospital or clinic. Record the average of the two measurements. To check your blood pressure when you are not at a hospital or clinic, you can use: ? An automated blood pressure machine at a pharmacy. ? A home blood pressure monitor.  Talk to your health care provider about your target blood pressure.  If you are between 77-73 years old, ask your health care provider if you should take aspirin to prevent heart disease.  Have regular diabetes screenings by checking your fasting blood sugar level. ? If you are at a normal weight and have a low risk for diabetes, have this test once every three years after the age of 10. ? If you are overweight and have a high risk for diabetes, consider being tested at a younger age or more often.  A one-time screening for abdominal aortic aneurysm (AAA) by ultrasound is recommended for men aged 65-75 years who are current or former smokers. What should I know about preventing infection? Hepatitis B If you have a higher risk for hepatitis B, you should be screened for this virus. Talk with your health care provider to find out if you are at risk for hepatitis B infection. Hepatitis C Blood testing is recommended for:  Everyone born from [birth year] through 1964.  Anyone with known risk factors for hepatitis C. Sexually Transmitted Diseases (STDs)  You should be screened each year for STDs including gonorrhea and chlamydia if: ? You are sexually active and are younger than 52 years of age. ? You are older than 52 years of age and your health care provider  tells you that you are at risk for this type of infection. ? Your sexual activity has changed since you were last screened and you are at an increased risk for chlamydia or gonorrhea. Ask your health care provider if you are at risk.  Talk with your health care provider about whether you are at high risk of being infected with HIV. Your health care provider may recommend a prescription medicine to help prevent HIV infection. What else can I do?  Schedule regular health, dental, and eye exams.  Stay current with your vaccines (immunizations).  Do not use any tobacco products, such as cigarettes, chewing tobacco, and e-cigarettes. If you need help quitting, ask your health care provider.  Limit alcohol intake to no more than 2 drinks per day. One drink equals 12 ounces of beer, 5 ounces of wine, or 1 ounces of hard liquor.  Do not use street drugs.  Do not share needles.  Ask your health care provider for help if you need support or information about quitting drugs.  Tell your health care provider if you often feel depressed.  Tell your health care provider if you have ever  been abused or do not feel safe at home. This information is not intended to replace advice given to you by your health care provider. Make sure you discuss any questions you have with your health care provider. Document Released: 03/17/2008 Document Revised: 05/18/2016 Document Reviewed: 06/23/2015 Elsevier Interactive Patient Education  2019 [employer]     Edwina Barth, MD [facility]

## 2018-10-12 LAB — COMPREHENSIVE METABOLIC PANEL
ALT: 13 IU/L (ref 0–44)
AST: 15 IU/L (ref 0–40)
Albumin/Globulin Ratio: 1.1 — ABNORMAL LOW (ref 1.2–2.2)
Albumin: 3.9 g/dL (ref 3.5–5.5)
Alkaline Phosphatase: 70 IU/L (ref 39–117)
BUN/Creatinine Ratio: 9 (ref 9–20)
BUN: 7 mg/dL (ref 6–24)
Bilirubin Total: 0.4 mg/dL (ref 0.0–1.2)
CO2: 24 mmol/L (ref 20–29)
Calcium: 9 mg/dL (ref 8.7–10.2)
Chloride: 102 mmol/L (ref 96–106)
Creatinine, Ser: 0.79 mg/dL (ref 0.76–1.27)
GFR calc Af Amer: 120 mL/min/{1.73_m2} (ref >59)
GFR calc non Af Amer: 104 mL/min/{1.73_m2} (ref >59)
Globulin, Total: 3.7 g/dL (ref 1.5–4.5)
Glucose: 121 mg/dL — ABNORMAL HIGH (ref 65–99)
Potassium: 4 mmol/L (ref 3.5–5.2)
Sodium: 139 mmol/L (ref 134–144)
Total Protein: 7.6 g/dL (ref 6.0–8.5)

## 2018-10-12 LAB — CBC WITH DIFFERENTIAL/PLATELET
Basophils Absolute: 0 10*3/uL (ref 0.0–0.2)
Basos: 0 %
EOS (ABSOLUTE): 0.1 10*3/uL (ref 0.0–0.4)
Eos: 1 %
Hematocrit: 41.2 % (ref 37.5–51.0)
Hemoglobin: 13.9 g/dL (ref 13.0–17.7)
Immature Grans (Abs): 0 10*3/uL (ref 0.0–0.1)
Immature Granulocytes: 0 %
Lymphocytes Absolute: 3.2 10*3/uL — ABNORMAL HIGH (ref 0.7–3.1)
Lymphs: 46 %
MCH: 30.1 pg (ref 26.6–33.0)
MCHC: 33.7 g/dL (ref 31.5–35.7)
MCV: 89 fL (ref 79–97)
Monocytes Absolute: 0.6 10*3/uL (ref 0.1–0.9)
Monocytes: 8 %
Neutrophils Absolute: 3.1 10*3/uL (ref 1.4–7.0)
Neutrophils: 45 %
Platelets: 226 10*3/uL (ref 150–450)
RBC: 4.62 x10E6/uL (ref 4.14–5.80)
RDW: 13.4 % (ref 11.6–15.4)
WBC: 7 10*3/uL (ref 3.4–10.8)

## 2018-10-12 LAB — LIPID PANEL
Chol/HDL Ratio: 4.2 ratio (ref 0.0–5.0)
Cholesterol, Total: 150 mg/dL (ref 100–199)
HDL: 36 mg/dL — ABNORMAL LOW (ref >39)
LDL Calculated: 92 mg/dL (ref 0–99)
Triglycerides: 112 mg/dL (ref 0–149)
VLDL Cholesterol Cal: 22 mg/dL (ref 5–40)

## 2018-10-12 LAB — HEMOGLOBIN A1C
Est. average glucose Bld gHb Est-mCnc: 137 mg/dL
Hgb A1c MFr Bld: 6.4 % — ABNORMAL HIGH (ref 4.8–5.6)

## 2018-10-12 LAB — TSH: TSH: <0.006 u[IU]/mL — ABNORMAL LOW (ref 0.450–4.500)

## 2020-04-07 ENCOUNTER — Ambulatory Visit: Payer: BC Managed Care – PPO

## 2020-04-07 ENCOUNTER — Other Ambulatory Visit: Payer: Self-pay

## 2020-04-07 DIAGNOSIS — Z0000 Encounter for general adult medical examination without abnormal findings: Secondary | ICD-10-CM

## 2020-04-08 LAB — CMP14+EGFR
ALT: 11 IU/L (ref 0–44)
AST: 13 IU/L (ref 0–40)
Albumin/Globulin Ratio: 1.2 (ref 1.2–2.2)
Albumin: 3.8 g/dL (ref 3.8–4.9)
Alkaline Phosphatase: 69 IU/L (ref 48–121)
BUN/Creatinine Ratio: 13 (ref 9–20)
BUN: 10 mg/dL (ref 6–24)
Bilirubin Total: 0.6 mg/dL (ref 0.0–1.2)
CO2: 25 mmol/L (ref 20–29)
Calcium: 9.3 mg/dL (ref 8.7–10.2)
Chloride: 102 mmol/L (ref 96–106)
Creatinine, Ser: 0.75 mg/dL — ABNORMAL LOW (ref 0.76–1.27)
GFR calc Af Amer: 122 mL/min/{1.73_m2} (ref >59)
GFR calc non Af Amer: 105 mL/min/{1.73_m2} (ref >59)
Globulin, Total: 3.3 g/dL (ref 1.5–4.5)
Glucose: 101 mg/dL — ABNORMAL HIGH (ref 65–99)
Potassium: 4.4 mmol/L (ref 3.5–5.2)
Sodium: 140 mmol/L (ref 134–144)
Total Protein: 7.1 g/dL (ref 6.0–8.5)

## 2020-04-08 LAB — CBC WITH DIFFERENTIAL/PLATELET
Basophils Absolute: 0 10*3/uL (ref 0.0–0.2)
Basos: 0 %
EOS (ABSOLUTE): 0.2 10*3/uL (ref 0.0–0.4)
Eos: 4 %
Hematocrit: 42.4 % (ref 37.5–51.0)
Hemoglobin: 13.8 g/dL (ref 13.0–17.7)
Immature Grans (Abs): 0 10*3/uL (ref 0.0–0.1)
Immature Granulocytes: 0 %
Lymphocytes Absolute: 2.5 10*3/uL (ref 0.7–3.1)
Lymphs: 53 %
MCH: 30.2 pg (ref 26.6–33.0)
MCHC: 32.5 g/dL (ref 31.5–35.7)
MCV: 93 fL (ref 79–97)
Monocytes Absolute: 0.5 10*3/uL (ref 0.1–0.9)
Monocytes: 9 %
Neutrophils Absolute: 1.7 10*3/uL (ref 1.4–7.0)
Neutrophils: 34 %
Platelets: 190 10*3/uL (ref 150–450)
RBC: 4.57 x10E6/uL (ref 4.14–5.80)
RDW: 13.1 % (ref 11.6–15.4)
WBC: 4.9 10*3/uL (ref 3.4–10.8)

## 2020-04-08 LAB — TSH: TSH: <0.005 u[IU]/mL — ABNORMAL LOW (ref 0.450–4.500)

## 2020-04-08 LAB — LIPID PANEL
Chol/HDL Ratio: 3.8 ratio (ref 0.0–5.0)
Cholesterol, Total: 157 mg/dL (ref 100–199)
HDL: 41 mg/dL (ref >39)
LDL Chol Calc (NIH): 104 mg/dL — ABNORMAL HIGH (ref 0–99)
Triglycerides: 62 mg/dL (ref 0–149)
VLDL Cholesterol Cal: 12 mg/dL (ref 5–40)

## 2020-04-08 LAB — HEMOGLOBIN A1C
Est. average glucose Bld gHb Est-mCnc: 120 mg/dL
Hgb A1c MFr Bld: 5.8 % — ABNORMAL HIGH (ref 4.8–5.6)

## 2020-04-14 ENCOUNTER — Encounter: Payer: Self-pay | Admitting: Emergency Medicine

## 2020-04-14 ENCOUNTER — Ambulatory Visit (INDEPENDENT_AMBULATORY_CARE_PROVIDER_SITE_OTHER): Payer: BC Managed Care – PPO | Admitting: Emergency Medicine

## 2020-04-14 ENCOUNTER — Other Ambulatory Visit: Payer: Self-pay

## 2020-04-14 VITALS — BP 126/77 | HR 102 | Temp 96.6°F | Resp 16 | Ht 68.0 in | Wt 256.0 lb

## 2020-04-14 DIAGNOSIS — Z6838 Body mass index (BMI) 38.0-38.9, adult: Secondary | ICD-10-CM

## 2020-04-14 DIAGNOSIS — Z0001 Encounter for general adult medical examination with abnormal findings: Secondary | ICD-10-CM

## 2020-04-14 DIAGNOSIS — Z0000 Encounter for general adult medical examination without abnormal findings: Secondary | ICD-10-CM

## 2020-04-14 DIAGNOSIS — R7303 Prediabetes: Secondary | ICD-10-CM

## 2020-04-14 NOTE — Progress Notes (Signed)
Brett Guerrero 54 y.o.   Chief Complaint  Patient presents with  . Annual Exam    HISTORY OF PRESENT ILLNESS: This is a 54 y.o. male here for annual exam. No chronic medical problems.  On no chronic medications. Non-smoker and no EtOH abuser. Eating better and exercising more.  Losing weight. Wt Readings from Last 3 Encounters:  04/14/20 256 lb (116.1 kg)  10/11/18 285 lb 12.8 oz (129.6 kg)  05/09/18 240 lb (108.9 kg)  Recent blood work results reviewed with patient.  Prediabetes with hemoglobin A1c of 5.8.  Otherwise normal labs. Has no complaints or medical concerns today.   HPI   Prior to Admission medications   Medication Sig Start Date End Date Taking? Authorizing Provider  cyclobenzaprine (FLEXERIL) 5 MG tablet Take 1 tablet (5 mg total) by mouth 3 (three) times daily as needed for muscle spasms. Patient not taking: Reported on 10/11/2018 05/09/18   Brett Rose, MD  OVER THE COUNTER MEDICATION     [provider]  predniSONE (DELTASONE) 10 MG tablet Take 6 tabs (60 mg) PO x 3 days, then take 4 tabs (40 mg) PO x 3 days, then take 2 tabs (20 mg) PO x 3 days, then take 1 tab (10 mg) PO x 3 days, then take 1/2 tab (5 mg) PO x 4 days. Patient not taking: Reported on 10/11/2018 05/09/18   Brett Rose, MD  promethazine (PHENERGAN) 25 MG tablet Take 1 tablet (25 mg total) by mouth every 8 (eight) hours as needed for nausea (headache). Patient not taking: Reported on 10/11/2018 05/07/17   Brett Creamer, MD    No Known Allergies  There are no problems to display for this patient.   Past Medical History:  Diagnosis Date  . Sciatica of left side     History reviewed. No pertinent surgical history.  Social History   Socioeconomic History  . Marital status: Married    Spouse name: Not on file  . Number of children: Not on file  . Years of education: Not on file  . Highest education level: Not on file  Occupational History  . Not on file  Tobacco Use  . Smoking status:  Never Smoker  . Smokeless tobacco: Never Used  Vaping Use  . Vaping Use: Never used  Substance and Sexual Activity  . Alcohol use: No  . Drug use: No  . Sexual activity: Not on file  Other Topics Concern  . Not on file  Social History Narrative  . Not on file   Social Determinants of Health   Financial Resource Strain:   . Difficulty of Paying Living Expenses:   Food Insecurity:   . Worried About Programme researcher, broadcasting/film/video in the Last Year:   . Barista in the Last Year:   Transportation Needs:   . Freight forwarder (Medical):   Marland Kitchen Lack of Transportation (Non-Medical):   Physical Activity:   . Days of Exercise per Week:   . Minutes of Exercise per Session:   Stress:   . Feeling of Stress :   Social Connections:   . Frequency of Communication with Friends and Family:   . Frequency of Social Gatherings with Friends and Family:   . Attends Religious Services:   . Active Member of Clubs or Organizations:   . Attends Banker Meetings:   Marland Kitchen Marital Status:   Intimate Partner Violence:   . Fear of Current or Ex-Partner:   . Emotionally Abused:   .  Physically Abused:   . Sexually Abused:     History reviewed. No pertinent family history.   Review of Systems  Constitutional: Negative.  Negative for chills and fever.  HENT: Negative.  Negative for congestion and sore throat.   Eyes: Negative.   Respiratory: Negative.  Negative for cough and shortness of breath.   Cardiovascular: Negative.  Negative for chest pain and palpitations.  Gastrointestinal: Negative.  Negative for abdominal pain, diarrhea, nausea and vomiting.  Genitourinary: Negative.  Negative for dysuria.  Musculoskeletal: Negative.  Negative for myalgias.  Skin: Negative.  Negative for rash.  Neurological: Negative.  Negative for dizziness and headaches.  All other systems reviewed and are negative.    Physical Exam Vitals reviewed.  Constitutional:      Appearance: He is obese.    HENT:     Head: Normocephalic.     Mouth/Throat:     Mouth: Mucous membranes are moist.     Pharynx: Oropharynx is clear.  Eyes:     Extraocular Movements: Extraocular movements intact.     Conjunctiva/sclera: Conjunctivae normal.     Pupils: Pupils are equal, round, and reactive to light.  Cardiovascular:     Rate and Rhythm: Normal rate and regular rhythm.     Pulses: Normal pulses.     Heart sounds: Normal heart sounds.  Pulmonary:     Effort: Pulmonary effort is normal.     Breath sounds: Normal breath sounds.  Abdominal:     General: Bowel sounds are normal. There is no distension.     Palpations: Abdomen is soft. There is no mass.     Tenderness: There is no abdominal tenderness.  Musculoskeletal:        General: Normal range of motion.     Cervical back: Normal range of motion and neck supple.  Skin:    General: Skin is warm and dry.     Capillary Refill: Capillary refill takes less than 2 seconds.  Neurological:     General: No focal deficit present.     Mental Status: He is alert and oriented to person, place, and time.  Psychiatric:        Mood and Affect: Mood normal.        Behavior: Behavior normal.      ASSESSMENT & PLAN: [name] was seen today for annual exam.  Diagnoses and all orders for this visit:  Routine general medical examination at a health care facility  Prediabetes  Body mass index (BMI) of 38.0-38.9 in adult    Patient Instructions       If you have lab work done today you will be contacted with your lab results within the next 2 weeks.  If you have not heard from [place] then please contact us. The fastest way to get your results is to register for My Chart.   IF you received an x-ray today, you will receive an invoice from [facility]. Please contact [facility] at [phone] with questions or concerns regarding your invoice.   IF you received labwork today, you will receive an invoice from [facility]. Please contact  [facility] at [phone] with questions or concerns regarding your invoice.   Our billing staff will not be able to assist you with questions regarding bills from these companies.  You will be contacted with the lab results as soon as they are available. The fastest way to get your results is to activate your My Chart account. Instructions are located on the last page of  this paperwork. If you have not heard from [place] regarding the results in 2 weeks, please contact this office.      Calorie Counting for Weight Loss Calories are units of energy. Your body needs a certain amount of calories from food to keep you going throughout the day. When you eat more calories than your body needs, your body stores the extra calories as fat. When you eat fewer calories than your body needs, your body burns fat to get the energy it needs. Calorie counting means keeping track of how many calories you eat and drink each day. Calorie counting can be helpful if you need to lose weight. If you make sure to eat fewer calories than your body needs, you should lose weight. Ask your health care provider what a healthy weight is for you. For calorie counting to work, you will need to eat the right number of calories in a day in order to lose a healthy amount of weight per week. A dietitian can help you determine how many calories you need in a day and will give you suggestions on how to reach your calorie goal.  A healthy amount of weight to lose per week is usually 1-2 lb (0.5-0.9 kg). This usually means that your daily calorie intake should be reduced by 500-750 calories.  Eating 1,200 - 1,500 calories per day can help most women lose weight.  Eating 1,500 - 1,800 calories per day can help most men lose weight. What is my plan? My goal is to have __________ calories per day. If I have this many calories per day, I should lose around __________ pounds per week. What do I need to know about calorie counting? In order  to meet your daily calorie goal, you will need to:  Find out how many calories are in each food you would like to eat. Try to do this before you eat.  Decide how much of the food you plan to eat.  Write down what you ate and how many calories it had. Doing this is called keeping a food log. To successfully lose weight, it is important to balance calorie counting with a healthy lifestyle that includes regular activity. Aim for 150 minutes of moderate exercise (such as walking) or 75 minutes of vigorous exercise (such as running) each week. Where do I find calorie information?  The number of calories in a food can be found on a Nutrition Facts label. If a food does not have a Nutrition Facts label, try to look up the calories online or ask your dietitian for help. Remember that calories are listed per serving. If you choose to have more than one serving of a food, you will have to multiply the calories per serving by the amount of servings you plan to eat. For example, the label on a package of bread might say that a serving size is 1 slice and that there are 90 calories in a serving. If you eat 1 slice, you will have eaten 90 calories. If you eat 2 slices, you will have eaten 180 calories. How do I keep a food log? Immediately after each meal, record the following information in your food log:  What you ate. Don't forget to include toppings, sauces, and other extras on the food.  How much you ate. This can be measured in cups, ounces, or number of items.  How many calories each food and drink had.  The total number of calories in the meal. Keep your  food log near you, such as in a small notebook in your pocket, or use a mobile app or website. Some programs will calculate calories for you and show you how many calories you have left for the day to meet your goal. What are some calorie counting tips?   Use your calories on foods and drinks that will fill you up and not leave you  hungry: ? Some examples of foods that fill you up are nuts and nut butters, vegetables, lean proteins, and high-fiber foods like whole grains. High-fiber foods are foods with more than 5 g fiber per serving. ? Drinks such as sodas, specialty coffee drinks, alcohol, and juices have a lot of calories, yet do not fill you up.  Eat nutritious foods and avoid empty calories. Empty calories are calories you get from foods or beverages that do not have many vitamins or protein, such as candy, sweets, and soda. It is better to have a nutritious high-calorie food (such as an avocado) than a food with few nutrients (such as a bag of chips).  Know how many calories are in the foods you eat most often. This will help you calculate calorie counts faster.  Pay attention to calories in drinks. Low-calorie drinks include water and unsweetened drinks.  Pay attention to nutrition labels for "low fat" or "fat free" foods. These foods sometimes have the same amount of calories or more calories than the full fat versions. They also often have added sugar, starch, or salt, to make up for flavor that was removed with the fat.  Find a way of tracking calories that works for you. Get creative. Try different apps or programs if writing down calories does not work for you. What are some portion control tips?  Know how many calories are in a serving. This will help you know how many servings of a certain food you can have.  Use a measuring cup to measure serving sizes. You could also try weighing out portions on a kitchen scale. With time, you will be able to estimate serving sizes for some foods.  Take some time to put servings of different foods on your favorite plates, bowls, and cups so you know what a serving looks like.  Try not to eat straight from a bag or box. Doing this can lead to overeating. Put the amount you would like to eat in a cup or on a plate to make sure you are eating the right portion.  Use smaller  plates, glasses, and bowls to prevent overeating.  Try not to multitask (for example, watch TV or use your computer) while eating. If it is time to eat, sit down at a table and enjoy your food. This will help you to know when you are full. It will also help you to be aware of what you are eating and how much you are eating. What are tips for following this plan? Reading food labels  Check the calorie count compared to the serving size. The serving size may be smaller than what you are used to eating.  Check the source of the calories. Make sure the food you are eating is high in vitamins and protein and low in saturated and trans fats. Shopping  Read nutrition labels while you shop. This will help you make healthy decisions before you decide to purchase your food.  Make a grocery list and stick to it. Cooking  Try to cook your favorite foods in a healthier way. For example, try  baking instead of frying.  Use low-fat dairy products. Meal planning  Use more fruits and vegetables. Half of your plate should be fruits and vegetables.  Include lean proteins like poultry and fish. How do I count calories when eating out?  Ask for smaller portion sizes.  Consider sharing an entree and sides instead of getting your own entree.  If you get your own entree, eat only half. Ask for a box at the beginning of your meal and put the rest of your entree in it so you are not tempted to eat it.  If calories are listed on the menu, choose the lower calorie options.  Choose dishes that include vegetables, fruits, whole grains, low-fat dairy products, and lean protein.  Choose items that are boiled, broiled, grilled, or steamed. Stay away from items that are buttered, battered, fried, or served with cream sauce. Items labeled "crispy" are usually fried, unless stated otherwise.  Choose water, low-fat milk, unsweetened iced tea, or other drinks without added sugar. If you want an alcoholic beverage,  choose a lower calorie option such as a glass of wine or light beer.  Ask for dressings, sauces, and syrups on the side. These are usually high in calories, so you should limit the amount you eat.  If you want a salad, choose a garden salad and ask for grilled meats. Avoid extra toppings like bacon, cheese, or fried items. Ask for the dressing on the side, or ask for olive oil and vinegar or lemon to use as dressing.  Estimate how many servings of a food you are given. For example, a serving of cooked rice is  cup or about the size of half a baseball. Knowing serving sizes will help you be aware of how much food you are eating at restaurants. The list below tells you how big or small some common portion sizes are based on everyday objects: ? 1 oz--4 stacked dice. ? 3 oz--1 deck of cards. ? 1 tsp--1 die. ? 1 Tbsp-- a ping-pong ball. ? 2 Tbsp--1 ping-pong ball. ?  cup-- baseball. ? 1 cup--1 baseball. Summary  Calorie counting means keeping track of how many calories you eat and drink each day. If you eat fewer calories than your body needs, you should lose weight.  A healthy amount of weight to lose per week is usually 1-2 lb (0.5-0.9 kg). This usually means reducing your daily calorie intake by 500-750 calories.  The number of calories in a food can be found on a Nutrition Facts label. If a food does not have a Nutrition Facts label, try to look up the calories online or ask your dietitian for help.  Use your calories on foods and drinks that will fill you up, and not on foods and drinks that will leave you hungry.  Use smaller plates, glasses, and bowls to prevent overeating. This information is not intended to replace advice given to you by your health care provider. Make sure you discuss any questions you have with your health care provider. Document Revised: 06/08/2018 Document Reviewed: 08/19/2016 Elsevier Patient Education  2020 [employer]   Health Maintenance,  Male Adopting a healthy lifestyle and getting preventive care are important in promoting health and wellness. Ask your health care provider about:  The right schedule for you to have regular tests and exams.  Things you can do on your own to prevent diseases and keep yourself healthy. What should I know about diet, weight, and exercise? Eat a healthy diet  Eat a diet that includes plenty of vegetables, fruits, low-fat dairy products, and lean protein.  Do not eat a lot of foods that are high in solid fats, added sugars, or sodium. Maintain a healthy weight Body mass index (BMI) is a measurement that can be used to identify possible weight problems. It estimates body fat based on height and weight. Your health care provider can help determine your BMI and help you achieve or maintain a healthy weight. Get regular exercise Get regular exercise. This is one of the most important things you can do for your health. Most adults should:  Exercise for at least 150 minutes each week. The exercise should increase your heart rate and make you sweat (moderate-intensity exercise).  Do strengthening exercises at least twice a week. This is in addition to the moderate-intensity exercise.  Spend less time sitting. Even light physical activity can be beneficial. Watch cholesterol and blood lipids Have your blood tested for lipids and cholesterol at 53 years of age, then have this test every 5 years. You may need to have your cholesterol levels checked more often if:  Your lipid or cholesterol levels are high.  You are older than 53 years of age.  You are at high risk for heart disease. What should I know about cancer screening? Many types of cancers can be detected early and may often be prevented. Depending on your health history and family history, you may need to have cancer screening at various ages. This may include screening for:  Colorectal cancer.  Prostate cancer.  Skin  cancer.  Lung cancer. What should I know about heart disease, diabetes, and high blood pressure? Blood pressure and heart disease  High blood pressure causes heart disease and increases the risk of stroke. This is more likely to develop in people who have high blood pressure readings, are of African descent, or are overweight.  Talk with your health care provider about your target blood pressure readings.  Have your blood pressure checked: ? Every 3-5 years if you are 48-31 years of age. ? Every year if you are 19 years old or older.  If you are between the ages of 7 and 24 and are a current or former smoker, ask your health care provider if you should have a one-time screening for abdominal aortic aneurysm (AAA). Diabetes Have regular diabetes screenings. This checks your fasting blood sugar level. Have the screening done:  Once every three years after age 32 if you are at a normal weight and have a low risk for diabetes.  More often and at a younger age if you are overweight or have a high risk for diabetes. What should I know about preventing infection? Hepatitis B If you have a higher risk for hepatitis B, you should be screened for this virus. Talk with your health care provider to find out if you are at risk for hepatitis B infection. Hepatitis C Blood testing is recommended for:  Everyone born from [birth year] through 1964.  Anyone with known risk factors for hepatitis C. Sexually transmitted infections (STIs)  You should be screened each year for STIs, including gonorrhea and chlamydia, if: ? You are sexually active and are younger than 53 years of age. ? You are older than 53 years of age and your health care provider tells you that you are at risk for this type of infection. ? Your sexual activity has changed since you were last screened, and you are at increased risk for chlamydia or  gonorrhea. Ask your health care provider if you are at risk.  Ask your health care provider  about whether you are at high risk for HIV. Your health care provider may recommend a prescription medicine to help prevent HIV infection. If you choose to take medicine to prevent HIV, you should first get tested for HIV. You should then be tested every 3 months for as long as you are taking the medicine. Follow these instructions at home: Lifestyle  Do not use any products that contain nicotine or tobacco, such as cigarettes, e-cigarettes, and chewing tobacco. If you need help quitting, ask your health care provider.  Do not use street drugs.  Do not share needles.  Ask your health care provider for help if you need support or information about quitting drugs. Alcohol use  Do not drink alcohol if your health care provider tells you not to drink.  If you drink alcohol: ? Limit how much you have to 0-2 drinks a day. ? Be aware of how much alcohol is in your drink. In the [place] one drink equals one 12 oz bottle of beer (355 mL), one 5 oz glass of wine (148 mL), or one 1 oz glass of hard liquor (44 mL). General instructions  Schedule regular health, dental, and eye exams.  Stay current with your vaccines.  Tell your health care provider if: ? You often feel depressed. ? You have ever been abused or do not feel safe at home. Summary  Adopting a healthy lifestyle and getting preventive care are important in promoting health and wellness.  Follow your health care provider's instructions about healthy diet, exercising, and getting tested or screened for diseases.  Follow your health care provider's instructions on monitoring your cholesterol and blood pressure. This information is not intended to replace advice given to you by your health care provider. Make sure you discuss any questions you have with your health care provider. Document Revised: 09/12/2018 Document Reviewed: 09/12/2018 Elsevier Patient Education  2020 Elsevier Inc.      Edwina Barth, MD [facility]

## 2020-04-14 NOTE — Patient Instructions (Addendum)
   If you have lab work done today you will be contacted with your lab results within the next 2 weeks.  If you have not heard from us then please contact us. The fastest way to get your results is to register for My Chart.   IF you received an x-ray today, you will receive an invoice from [facility]. Please contact [facility] at [phone] with questions or concerns regarding your invoice.   IF you received labwork today, you will receive an invoice from [facility]. Please contact [facility] at [phone] with questions or concerns regarding your invoice.   Our billing staff will not be able to assist you with questions regarding bills from these companies.  You will be contacted with the lab results as soon as they are available. The fastest way to get your results is to activate your My Chart account. Instructions are located on the last page of this paperwork. If you have not heard from us regarding the results in 2 weeks, please contact this office.      Calorie Counting for Weight Loss Calories are units of energy. Your body needs a certain amount of calories from food to keep you going throughout the day. When you eat more calories than your body needs, your body stores the extra calories as fat. When you eat fewer calories than your body needs, your body burns fat to get the energy it needs. Calorie counting means keeping track of how many calories you eat and drink each day. Calorie counting can be helpful if you need to lose weight. If you make sure to eat fewer calories than your body needs, you should lose weight. Ask your health care provider what a healthy weight is for you. For calorie counting to work, you will need to eat the right number of calories in a day in order to lose a healthy amount of weight per week. A dietitian can help you determine how many calories you need in a day and will give you suggestions on how to reach your calorie goal.  A healthy  amount of weight to lose per week is usually 1-2 lb (0.5-0.9 kg). This usually means that your daily calorie intake should be reduced by 500-750 calories.  Eating 1,200 - 1,500 calories per day can help most women lose weight.  Eating 1,500 - 1,800 calories per day can help most men lose weight. What is my plan? My goal is to have __________ calories per day. If I have this many calories per day, I should lose around __________ pounds per week. What do I need to know about calorie counting? In order to meet your daily calorie goal, you will need to:  Find out how many calories are in each food you would like to eat. Try to do this before you eat.  Decide how much of the food you plan to eat.  Write down what you ate and how many calories it had. Doing this is called keeping a food log. To successfully lose weight, it is important to balance calorie counting with a healthy lifestyle that includes regular activity. Aim for 150 minutes of moderate exercise (such as walking) or 75 minutes of vigorous exercise (such as running) each week. Where do I find calorie information?  The number of calories in a food can be found on a Nutrition Facts label. If a food does not have a Nutrition Facts label, try to look up the calories online or ask your dietitian   for help. Remember that calories are listed per serving. If you choose to have more than one serving of a food, you will have to multiply the calories per serving by the amount of servings you plan to eat. For example, the label on a package of bread might say that a serving size is 1 slice and that there are 90 calories in a serving. If you eat 1 slice, you will have eaten 90 calories. If you eat 2 slices, you will have eaten 180 calories. How do I keep a food log? Immediately after each meal, record the following information in your food log:  What you ate. Don't forget to include toppings, sauces, and other extras on the food.  How much you  ate. This can be measured in cups, ounces, or number of items.  How many calories each food and drink had.  The total number of calories in the meal. Keep your food log near you, such as in a small notebook in your pocket, or use a mobile app or website. Some programs will calculate calories for you and show you how many calories you have left for the day to meet your goal. What are some calorie counting tips?   Use your calories on foods and drinks that will fill you up and not leave you hungry: ? Some examples of foods that fill you up are nuts and nut butters, vegetables, lean proteins, and high-fiber foods like whole grains. High-fiber foods are foods with more than 5 g fiber per serving. ? Drinks such as sodas, specialty coffee drinks, alcohol, and juices have a lot of calories, yet do not fill you up.  Eat nutritious foods and avoid empty calories. Empty calories are calories you get from foods or beverages that do not have many vitamins or protein, such as candy, sweets, and soda. It is better to have a nutritious high-calorie food (such as an avocado) than a food with few nutrients (such as a bag of chips).  Know how many calories are in the foods you eat most often. This will help you calculate calorie counts faster.  Pay attention to calories in drinks. Low-calorie drinks include water and unsweetened drinks.  Pay attention to nutrition labels for "low fat" or "fat free" foods. These foods sometimes have the same amount of calories or more calories than the full fat versions. They also often have added sugar, starch, or salt, to make up for flavor that was removed with the fat.  Find a way of tracking calories that works for you. Get creative. Try different apps or programs if writing down calories does not work for you. What are some portion control tips?  Know how many calories are in a serving. This will help you know how many servings of a certain food you can have.  Use a  measuring cup to measure serving sizes. You could also try weighing out portions on a kitchen scale. With time, you will be able to estimate serving sizes for some foods.  Take some time to put servings of different foods on your favorite plates, bowls, and cups so you know what a serving looks like.  Try not to eat straight from a bag or box. Doing this can lead to overeating. Put the amount you would like to eat in a cup or on a plate to make sure you are eating the right portion.  Use smaller plates, glasses, and bowls to prevent overeating.  Try not to multitask (for   example, watch TV or use your computer) while eating. If it is time to eat, sit down at a table and enjoy your food. This will help you to know when you are full. It will also help you to be aware of what you are eating and how much you are eating. What are tips for following this plan? Reading food labels  Check the calorie count compared to the serving size. The serving size may be smaller than what you are used to eating.  Check the source of the calories. Make sure the food you are eating is high in vitamins and protein and low in saturated and trans fats. Shopping  Read nutrition labels while you shop. This will help you make healthy decisions before you decide to purchase your food.  Make a grocery list and stick to it. Cooking  Try to cook your favorite foods in a healthier way. For example, try baking instead of frying.  Use low-fat dairy products. Meal planning  Use more fruits and vegetables. Half of your plate should be fruits and vegetables.  Include lean proteins like poultry and fish. How do I count calories when eating out?  Ask for smaller portion sizes.  Consider sharing an entree and sides instead of getting your own entree.  If you get your own entree, eat only half. Ask for a box at the beginning of your meal and put the rest of your entree in it so you are not tempted to eat it.  If calories  are listed on the menu, choose the lower calorie options.  Choose dishes that include vegetables, fruits, whole grains, low-fat dairy products, and lean protein.  Choose items that are boiled, broiled, grilled, or steamed. Stay away from items that are buttered, battered, fried, or served with cream sauce. Items labeled "crispy" are usually fried, unless stated otherwise.  Choose water, low-fat milk, unsweetened iced tea, or other drinks without added sugar. If you want an alcoholic beverage, choose a lower calorie option such as a glass of wine or light beer.  Ask for dressings, sauces, and syrups on the side. These are usually high in calories, so you should limit the amount you eat.  If you want a salad, choose a garden salad and ask for grilled meats. Avoid extra toppings like bacon, cheese, or fried items. Ask for the dressing on the side, or ask for olive oil and vinegar or lemon to use as dressing.  Estimate how many servings of a food you are given. For example, a serving of cooked rice is  cup or about the size of half a baseball. Knowing serving sizes will help you be aware of how much food you are eating at restaurants. The list below tells you how big or small some common portion sizes are based on everyday objects: ? 1 oz--4 stacked dice. ? 3 oz--1 deck of cards. ? 1 tsp--1 die. ? 1 Tbsp-- a ping-pong ball. ? 2 Tbsp--1 ping-pong ball. ?  cup-- baseball. ? 1 cup--1 baseball. Summary  Calorie counting means keeping track of how many calories you eat and drink each day. If you eat fewer calories than your body needs, you should lose weight.  A healthy amount of weight to lose per week is usually 1-2 lb (0.5-0.9 kg). This usually means reducing your daily calorie intake by 500-750 calories.  The number of calories in a food can be found on a Nutrition Facts label. If a food does not have a Nutrition  Facts label, try to look up the calories online or ask your dietitian for  help.  Use your calories on foods and drinks that will fill you up, and not on foods and drinks that will leave you hungry.  Use smaller plates, glasses, and bowls to prevent overeating. This information is not intended to replace advice given to you by your health care provider. Make sure you discuss any questions you have with your health care provider. Document Revised: 06/08/2018 Document Reviewed: 08/19/2016 Elsevier Patient Education  2020 [employer].   Health Maintenance, Male Adopting a healthy lifestyle and getting preventive care are important in promoting health and wellness. Ask your health care provider about:  The right schedule for you to have regular tests and exams.  Things you can do on your own to prevent diseases and keep yourself healthy. What should I know about diet, weight, and exercise? Eat a healthy diet   Eat a diet that includes plenty of vegetables, fruits, low-fat dairy products, and lean protein.  Do not eat a lot of foods that are high in solid fats, added sugars, or sodium. Maintain a healthy weight Body mass index (BMI) is a measurement that can be used to identify possible weight problems. It estimates body fat based on height and weight. Your health care provider can help determine your BMI and help you achieve or maintain a healthy weight. Get regular exercise Get regular exercise. This is one of the most important things you can do for your health. Most adults should:  Exercise for at least 150 minutes each week. The exercise should increase your heart rate and make you sweat (moderate-intensity exercise).  Do strengthening exercises at least twice a week. This is in addition to the moderate-intensity exercise.  Spend less time sitting. Even light physical activity can be beneficial. Watch cholesterol and blood lipids Have your blood tested for lipids and cholesterol at 53 years of age, then have this test every 5 years. You may need to have  your cholesterol levels checked more often if:  Your lipid or cholesterol levels are high.  You are older than 53 years of age.  You are at high risk for heart disease. What should I know about cancer screening? Many types of cancers can be detected early and may often be prevented. Depending on your health history and family history, you may need to have cancer screening at various ages. This may include screening for:  Colorectal cancer.  Prostate cancer.  Skin cancer.  Lung cancer. What should I know about heart disease, diabetes, and high blood pressure? Blood pressure and heart disease  High blood pressure causes heart disease and increases the risk of stroke. This is more likely to develop in people who have high blood pressure readings, are of African descent, or are overweight.  Talk with your health care provider about your target blood pressure readings.  Have your blood pressure checked: ? Every 3-5 years if you are 21-17 years of age. ? Every year if you are 63 years old or older.  If you are between the ages of 41 and 79 and are a current or former smoker, ask your health care provider if you should have a one-time screening for abdominal aortic aneurysm (AAA). Diabetes Have regular diabetes screenings. This checks your fasting blood sugar level. Have the screening done:  Once every three years after age 69 if you are at a normal weight and have a low risk for diabetes.  More  often and at a younger age if you are overweight or have a high risk for diabetes. What should I know about preventing infection? Hepatitis B If you have a higher risk for hepatitis B, you should be screened for this virus. Talk with your health care provider to find out if you are at risk for hepatitis B infection. Hepatitis C Blood testing is recommended for:  Everyone born from [birth year] through 1964.  Anyone with known risk factors for hepatitis C. Sexually transmitted infections  (STIs)  You should be screened each year for STIs, including gonorrhea and chlamydia, if: ? You are sexually active and are younger than 53 years of age. ? You are older than 53 years of age and your health care provider tells you that you are at risk for this type of infection. ? Your sexual activity has changed since you were last screened, and you are at increased risk for chlamydia or gonorrhea. Ask your health care provider if you are at risk.  Ask your health care provider about whether you are at high risk for HIV. Your health care provider may recommend a prescription medicine to help prevent HIV infection. If you choose to take medicine to prevent HIV, you should first get tested for HIV. You should then be tested every 3 months for as long as you are taking the medicine. Follow these instructions at home: Lifestyle  Do not use any products that contain nicotine or tobacco, such as cigarettes, e-cigarettes, and chewing tobacco. If you need help quitting, ask your health care provider.  Do not use street drugs.  Do not share needles.  Ask your health care provider for help if you need support or information about quitting drugs. Alcohol use  Do not drink alcohol if your health care provider tells you not to drink.  If you drink alcohol: ? Limit how much you have to 0-2 drinks a day. ? Be aware of how much alcohol is in your drink. In the [place] one drink equals one 12 oz bottle of beer (355 mL), one 5 oz glass of wine (148 mL), or one 1 oz glass of hard liquor (44 mL). General instructions  Schedule regular health, dental, and eye exams.  Stay current with your vaccines.  Tell your health care provider if: ? You often feel depressed. ? You have ever been abused or do not feel safe at home. Summary  Adopting a healthy lifestyle and getting preventive care are important in promoting health and wellness.  Follow your health care provider's instructions about healthy diet,  exercising, and getting tested or screened for diseases.  Follow your health care provider's instructions on monitoring your cholesterol and blood pressure. This information is not intended to replace advice given to you by your health care provider. Make sure you discuss any questions you have with your health care provider. Document Revised: 09/12/2018 Document Reviewed: 09/12/2018 Elsevier Patient Education  2020 [employer].

## 2020-05-01 DIAGNOSIS — J42 Unspecified chronic bronchitis: Secondary | ICD-10-CM | POA: Insufficient documentation

## 2020-06-05 ENCOUNTER — Ambulatory Visit: Payer: Self-pay

## 2020-06-10 ENCOUNTER — Encounter: Payer: Self-pay | Admitting: Emergency Medicine

## 2020-06-10 DIAGNOSIS — Z77120 Contact with and (suspected) exposure to mold (toxic): Secondary | ICD-10-CM

## 2020-06-11 NOTE — Telephone Encounter (Signed)
The important fact is that his symptoms are better and much improved.  No such thing as a blood toxicity test is available.  He is more than welcome to make an appointment for follow-up and reevaluation.

## 2020-08-06 ENCOUNTER — Ambulatory Visit (INDEPENDENT_AMBULATORY_CARE_PROVIDER_SITE_OTHER): Payer: BC Managed Care – PPO | Admitting: Pulmonary Disease

## 2020-08-06 ENCOUNTER — Other Ambulatory Visit
Admission: RE | Admit: 2020-08-06 | Discharge: 2020-08-06 | Disposition: A | Discharge status: Home / Self Care | Payer: BC Managed Care – PPO | Attending: Pulmonary Disease | Admitting: Pulmonary Disease

## 2020-08-06 ENCOUNTER — Encounter: Payer: Self-pay | Admitting: Pulmonary Disease

## 2020-08-06 ENCOUNTER — Other Ambulatory Visit: Payer: Self-pay

## 2020-08-06 VITALS — BP 130/84 | HR 100 | Temp 97.0°F | Ht 68.0 in | Wt 256.4 lb

## 2020-08-06 DIAGNOSIS — K219 Gastro-esophageal reflux disease without esophagitis: Secondary | ICD-10-CM

## 2020-08-06 DIAGNOSIS — J45909 Unspecified asthma, uncomplicated: Secondary | ICD-10-CM | POA: Diagnosis not present

## 2020-08-06 DIAGNOSIS — R0602 Shortness of breath: Secondary | ICD-10-CM | POA: Insufficient documentation

## 2020-08-06 LAB — CBC WITH DIFFERENTIAL/PLATELET
Abs Immature Granulocytes: 0.02 10*3/uL (ref 0.00–0.07)
Basophils Absolute: 0 10*3/uL (ref 0.0–0.1)
Basophils Relative: 0 %
Eosinophils Absolute: 0.3 10*3/uL (ref 0.0–0.5)
Eosinophils Relative: 6 %
HCT: 43.6 % (ref 39.0–52.0)
Hemoglobin: 14.2 g/dL (ref 13.0–17.0)
Immature Granulocytes: 0 %
Lymphocytes Relative: 50 %
Lymphs Abs: 2.3 10*3/uL (ref 0.7–4.0)
MCH: 29.9 pg (ref 26.0–34.0)
MCHC: 32.6 g/dL (ref 30.0–36.0)
MCV: 91.8 fL (ref 80.0–100.0)
Monocytes Absolute: 0.6 10*3/uL (ref 0.1–1.0)
Monocytes Relative: 12 %
Neutro Abs: 1.5 10*3/uL — ABNORMAL LOW (ref 1.7–7.7)
Neutrophils Relative %: 32 %
Platelets: 186 10*3/uL (ref 150–400)
RBC: 4.75 MIL/uL (ref 4.22–5.81)
RDW: 13.1 % (ref 11.5–15.5)
WBC: 4.7 10*3/uL (ref 4.0–10.5)
nRBC: 0 % (ref 0.0–0.2)

## 2020-08-06 MED ORDER — BREO ELLIPTA 200-25 MCG/INH IN AEPB: 1.0000 | INHALATION_SPRAY | Freq: Every day | RESPIRATORY_TRACT | 11 refills | Status: DC

## 2020-08-06 MED ORDER — ESOMEPRAZOLE MAGNESIUM 40 MG PO CPDR
40.0000 mg | DELAYED_RELEASE_CAPSULE | Freq: Every day | ORAL | 3 refills | Status: DC
Start: 2020-08-06 — End: 2021-07-27

## 2020-08-06 MED ORDER — BREO ELLIPTA 200-25 MCG/INH IN AEPB: 1.0000 | INHALATION_SPRAY | Freq: Every day | RESPIRATORY_TRACT | 0 refills | Status: AC

## 2020-08-06 NOTE — Patient Instructions (Addendum)
We are getting breathing test ordered.  I suspect you have asthma.   We will also get some blood work this can be done today in the main hospital area.   We are starting you on Breo Ellipta 200/25, 1 inhalation daily, make sure you rinse your mouth well after you use it.  We are also giving a prescription of Nexium 40 mg daily to control reflux as this can make asthma worse.   We will see you in follow-up in 6 to 8 weeks time call sooner should any new problems arise.

## 2020-08-06 NOTE — Progress Notes (Signed)
Subjective:    Patient ID: Brett Guerrero, male    DOB: 1966-08-23, 54 y.o.   MRN: [id number]  HPI Patient is a 54 year old lifelong never smoker, who presents as a self-referral for evaluation of chronic bronchitis. The patient notes that he has been having issues with shortness of breath, coughing and wheezing with production of copious clear to yellow-tinged phlegm. He feels like his chest is "crackling". He noted that on Memorial Day he had to visit an urgent care in [place]. [place], while he was visiting family there, he was diagnosed with acute bronchitis and treated with antibiotics. Sequently after that he noted that he had exposure to mold while his home was undergoing some repairs in June of this year. Around that time the cough started and he noticed that it would worsen at nighttime particularly when he was supine and noticed the "chest crackling". In July the cough became cyclical and would "come and go". He notes that albuterol nebulizers and inhalers helped him also cough drops help him. He is triggered now by strong odors and moderate exertion. He was evaluated at [facility] urgent care on 5 September and given albuterol after he was noted to be wheezing. He was also given instructions to take Mucinex and Benadryl as well as nasal saline. He was given a prednisone taper as well. Chest x-ray at that time was normal, not available for our direct review but the report states no acute cardiopulmonary disease. Final diagnosis was cough and wheezing. He does have significant gastroesophageal reflux symptoms.   Patient has an 8-year history in the [employer]. 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 He has been stationed in the past in [place], [place], [place] state, and [place]. Did not see combat.  He is originally from [place]. Recently traveled there [place]   Review of Systems A 10 point review of systems was performed and it is as noted above otherwise  negative.  Past Medical History:  Diagnosis Date  . Sciatica of left side    History reviewed. No pertinent surgical history.   There are no problems to display for this patient.  Family History  Problem Relation Age of Onset  . Cancer Mother   . Diabetes Mother   . Hyperlipidemia Father    Social History   Tobacco Use  . Smoking status: Never Smoker  . Smokeless tobacco: Never Used  Substance Use Topics  . Alcohol use: No   Military history as above.   Currently works at [employer] No Known Allergies  Current Meds  Medication Sig  . albuterol (VENTOLIN HFA) 108 (90 Base) MCG/ACT inhaler Inhale into the lungs.    There is no immunization history on file for this patient.     Objective:   Physical Exam BP 130/84 (BP Location: Left Arm, Patient Position: Sitting, Cuff Size: Normal)   Pulse 100   Temp (!) 97 F (36.1 C) (Temporal)   Ht 5\' 8"  (1.727 m)   Wt 256 lb 6.4 oz (116.3 kg)   SpO2 96%   BMI 38.99 kg/m  GENERAL: Well-developed well-nourished gentleman in no acute distress. No conversational dyspnea. HEAD: Normocephalic, atraumatic.  EYES: Pupils equal, round, reactive to light.  No scleral icterus.  MOUTH: Nose/mouth/throat not examined due to masking requirements for COVID 19. NECK: Supple. No thyromegaly. Trachea midline. No JVD.  No adenopathy. PULMONARY: Good air entry bilaterally. Coarse with rare end expiratory wheeze. CARDIOVASCULAR: S1 and S2. Regular rate and rhythm. No rubs, murmurs  or gallops heard. ABDOMEN: Benign. MUSCULOSKELETAL: No joint deformity, no clubbing, no edema.  NEUROLOGIC: No overt focal deficit, no gait disturbance, speech is fluent. SKIN: Intact,warm,dry. On limited exam, no rashes. PSYCH: Mood and behavior normal.     Assessment & Plan:     ICD-10-CM   1. Uncomplicated asthma, unspecified asthma severity, unspecified whether persistent  J45.909    Suspect the patient has developed asthma Will obtain PFTs CBC with  differential and allergen panel will be obtained Trial of Breo Ellipta  2. Gastroesophageal reflux disease, unspecified whether esophagitis present  K21.9    This is likely aggravating his asthma Trial of Nexium Antireflux measures  3. Shortness of breath  R06.02 Pulmonary Function Test [facility] Only    Allergy Panel 11, Mold Group    Allergen Panel (27) + IGE    CBC w/Diff    CANCELED: CBC w/Diff    CANCELED: Allergen Panel (27) + IGE   Likely related to asthma as above PFTs as noted    Orders Placed This Encounter  Procedures  . Allergy Panel 11, [facility]    Standing Status:   Future    Standing Expiration Date:   08/06/2021  . Allergen Panel (27) + IGE    Standing Status:   Future    Number of Occurrences:   1    Standing Expiration Date:   08/06/2021  . CBC w/Diff    Standing Status:   Future    Number of Occurrences:   1    Standing Expiration Date:   08/06/2021  . Pulmonary Function Test [facility] Only    Standing Status:   Future    Standing Expiration Date:   08/06/2021    Scheduling Instructions:     Next available.    Order Specific Question:   Full PFT: includes the following: basic spirometry, spirometry pre & post bronchodilator, diffusion capacity (DLCO), lung volumes    Answer:   Full PFT   Meds ordered this encounter  Medications  . fluticasone furoate-vilanterol (BREO ELLIPTA) 200-25 MCG/INH AEPB    Sig: Inhale 1 puff into the lungs daily.    Dispense:  30 each    Refill:  11  . fluticasone furoate-vilanterol (BREO ELLIPTA) 200-25 MCG/INH AEPB    Sig: Inhale 1 puff into the lungs daily for 1 day.    Dispense:  14 each    Refill:  0    Order Specific Question:   Lot Number?    Answer:   [id number]    Order Specific Question:   Expiration Date?    Answer:   04/02/2021    Order Specific Question:   Manufacturer?    Answer:   Gl[employer]Smith[employer] [[id number]]    Order Specific Question:   Quantity    Answer:   1  . esomeprazole (NEXIUM) 40 MG capsule    Sig: Take 1 capsule (40 mg  total) by mouth daily.    Dispense:  30 capsule    Refill:  3    Discussion:  Patient has likely developed asthma. He cannot recall prior asthma history. However his presentation is classic for allergy induced asthma. We will check allergen panel in mold sensitivity panel as well as CBC with differential. We will have PFTs performed. He has been given a trial of Breo Ellipta as above. It of gastroesophageal reflux is also imperative. Will also start Nexium 40 mg daily for control of GERD. We will see him in follow-up in 6 to 8 weeks time  he is to contact us prior to that time should any new difficulties arise.  Gailen Shelter, MD [facility]   *This note was dictated using voice recognition software/Dragon.  Despite best efforts to proofread, errors can occur which can change the meaning.  Any change was purely unintentional.

## 2020-08-08 LAB — ALLERGEN PANEL (27) + IGE
Alternaria Alternata IgE: <0.1 kU/L
Aspergillus Fumigatus IgE: <0.1 kU/L
Bahia Grass IgE: 0.65 kU/L — AB
Bermuda Grass IgE: 0.78 kU/L — AB
Cat Dander IgE: <0.1 kU/L
Cedar, Mountain IgE: 0.19 kU/L — AB
Cladosporium Herbarum IgE: <0.1 kU/L
Cocklebur IgE: 0.87 kU/L — AB
Cockroach, American IgE: <0.1 kU/L
Common Silver Birch IgE: 1 kU/L — AB
D Farinae IgE: 0.13 kU/L — AB
D Pteronyssinus IgE: 0.14 kU/L — AB
Dog Dander IgE: <0.1 kU/L
Elm, American IgE: 1.98 kU/L — AB
Hickory, White IgE: <0.1 kU/L
IgE (Immunoglobulin E), Serum: 860 IU/mL — ABNORMAL HIGH (ref 6–495)
Johnson Grass IgE: 0.26 kU/L — AB
Kentucky Bluegrass IgE: 0.11 kU/L — AB
Maple/Box Elder IgE: 0.96 kU/L — AB
Mucor Racemosus IgE: <0.1 kU/L
Oak, White IgE: 1.81 kU/L — AB
Penicillium Chrysogen IgE: <0.1 kU/L
Pigweed, Rough IgE: 0.37 kU/L — AB
Plantain, English IgE: 0.26 kU/L — AB
Ragweed, Short IgE: 0.34 kU/L — AB
Setomelanomma Rostrat: <0.1 kU/L
Timothy Grass IgE: 0.14 kU/L — AB
White Mulberry IgE: <0.1 kU/L

## 2020-08-08 LAB — MISC LABCORP TEST (SEND OUT): Labcorp test code: 62448

## 2020-08-13 ENCOUNTER — Telehealth: Payer: Self-pay | Admitting: Pulmonary Disease

## 2020-08-13 DIAGNOSIS — R768 Other specified abnormal immunological findings in serum: Secondary | ICD-10-CM

## 2020-08-13 NOTE — Telephone Encounter (Signed)
Salena Saner, MD  08/10/2020 10:52 AM EST     Good news is no sensitivity to mold. The likely cause however of his asthma is allergies he does have sensitivities particularly to trees and multiple other allergens. I recommend allergy evaluation his IgE is elevated. Pending allergy evaluation, we can consider Xolair in the future.   Patient is aware of results and voiced his understanding.  Referral has been placed to allergy, as patient is agreeable.  Nothing further needed.

## 2020-09-02 ENCOUNTER — Encounter: Payer: Self-pay | Admitting: Emergency Medicine

## 2020-09-03 NOTE — Telephone Encounter (Signed)
Pt is requesting guidance on fasting. Finding that bp levels are fluctuating which is not normal for him

## 2020-09-03 NOTE — Telephone Encounter (Signed)
Continue monitoring daily multiple blood pressure readings at home for the next 2 weeks and if persistently elevated may need medication.

## 2020-10-22 ENCOUNTER — Other Ambulatory Visit: Payer: Self-pay

## 2020-10-22 ENCOUNTER — Ambulatory Visit
Admission: RE | Admit: 2020-10-22 | Discharge: 2020-10-22 | Disposition: A | Discharge status: Home / Self Care | Payer: BC Managed Care – PPO | Source: Ambulatory Visit | Attending: Family Medicine | Admitting: Family Medicine

## 2020-10-22 VITALS — BP 130/85 | HR 97 | Temp 98.2°F | Resp 19 | Ht 68.0 in | Wt 240.0 lb

## 2020-10-22 DIAGNOSIS — J0110 Acute frontal sinusitis, unspecified: Secondary | ICD-10-CM | POA: Diagnosis not present

## 2020-10-22 MED ORDER — AMOXICILLIN-POT CLAVULANATE 875-125 MG PO TABS
1.0000 | ORAL_TABLET | Freq: Two times a day (BID) | ORAL | 0 refills | Status: DC
Start: 2020-10-22 — End: 2021-02-23

## 2020-10-22 MED ORDER — FLUTICASONE PROPIONATE 50 MCG/ACT NA SUSP: 2.0000 | Freq: Every day | NASAL | 2 refills | Status: DC

## 2020-10-22 NOTE — Discharge Instructions (Addendum)
Treating you for a sinus infection.  Take the medication as prescribed. COVID test pending

## 2020-10-22 NOTE — ED Provider Notes (Signed)
[name]    CSN: [id number] Arrival date & time: 10/22/20  1216      History   Chief Complaint Chief Complaint  Patient presents with  . Congestive Heart Failure    HPI Brett Guerrero is a 55 y.o. male.   Patient is a 55 year old male who presents today with nasal congestion, sinus pressure, loss of taste or smell.  This started on 1/14.  Symptoms have been consistent and not improving.  Negative PCR COVID test 2 days ago.  Denies any fever, chills, body aches, night sweats, cough, chest congestion, sore throat, ear pain.  Taking NyQuil.     Past Medical History:  Diagnosis Date  . Sciatica of left side     There are no problems to display for this patient.   History reviewed. No pertinent surgical history.     Home Medications    Prior to Admission medications   Medication Sig Start Date End Date Taking? Authorizing Provider  amoxicillin-clavulanate (AUGMENTIN) 875-125 MG tablet Take 1 tablet by mouth every 12 (twelve) hours. 10/22/20  Yes Shanie Mauzy A, NP  fluticasone (FLONASE) 50 MCG/ACT nasal spray Place 2 sprays into both nostrils daily. 10/22/20  Yes Shamiyah Ngu A, NP  fluticasone furoate-vilanterol (BREO ELLIPTA) 200-25 MCG/INH AEPB Inhale 1 puff into the lungs daily. 08/06/20  Yes Salena Saner, MD  albuterol (VENTOLIN HFA) 108 (90 Base) MCG/ACT inhaler Inhale into the lungs. 06/07/20   [provider]  esomeprazole (NEXIUM) 40 MG capsule Take 1 capsule (40 mg total) by mouth daily. 08/06/20   Salena Saner, MD  promethazine (PHENERGAN) 25 MG tablet Take 1 tablet (25 mg total) by mouth every 8 (eight) hours as needed for nausea (headache). Patient not taking: Reported on 10/11/2018 05/07/17 10/22/20  Sharyn Creamer, MD    Family History Family History  Problem Relation Age of Onset  . Cancer Mother   . Diabetes Mother   . Hyperlipidemia Father     Social History Social History   Tobacco Use  . Smoking status: Never Smoker  .  Smokeless tobacco: Never Used  Vaping Use  . Vaping Use: Never used  Substance Use Topics  . Alcohol use: No  . Drug use: No     Allergies   Patient has no known allergies.   Review of Systems Review of Systems   Physical Exam Triage Vital Signs ED Triage Vitals  Enc Vitals Group     BP 10/22/20 0841 130/85     Pulse Rate 10/22/20 0841 97     Resp 10/22/20 0841 19     Temp 10/22/20 0841 98.2 F (36.8 C)     Temp Source 10/22/20 0841 Oral     SpO2 10/22/20 0841 97 %     Weight 10/22/20 0843 240 lb (108.9 kg)     Height 10/22/20 0843 5\' 8"  (1.727 m)     Head Circumference --      Peak Flow --      Pain Score 10/22/20 0842 0     Pain Loc --      Pain Edu? --      Excl. in GC? --    No data found.  Updated Vital Signs BP 130/85 (BP Location: Left Arm)   Pulse 97   Temp 98.2 F (36.8 C) (Oral)   Resp 19   Ht 5\' 8"  (1.727 m)   Wt 240 lb (108.9 kg)   SpO2 97%   BMI 36.49 kg/m  Visual Acuity Right Eye Distance:   Left Eye Distance:   Bilateral Distance:    Right Eye Near:   Left Eye Near:    Bilateral Near:     Physical Exam Vitals and nursing note reviewed.  Constitutional:      General: He is not in acute distress.    Appearance: Normal appearance. He is not ill-appearing, toxic-appearing or diaphoretic.  HENT:     Head: Normocephalic and atraumatic.     Right Ear: Tympanic membrane and ear canal normal.     Left Ear: Tympanic membrane and ear canal normal.     Nose: Congestion present.     Right Turbinates: Swollen.     Left Turbinates: Swollen.     Right Sinus: Maxillary sinus tenderness present.     Left Sinus: Maxillary sinus tenderness present.     Mouth/Throat:     Pharynx: Oropharynx is clear.  Eyes:     Conjunctiva/sclera: Conjunctivae normal.  Pulmonary:     Effort: Pulmonary effort is normal.  Abdominal:     Palpations: Abdomen is soft.  Musculoskeletal:        General: Normal range of motion.     Cervical back: Normal range of  motion.  Skin:    General: Skin is warm and dry.  Neurological:     Mental Status: He is alert.  Psychiatric:        Mood and Affect: Mood normal.      UC Treatments / Results  Labs (all labs ordered are listed, but only abnormal results are displayed) Labs Reviewed  NOVEL CORONAVIRUS, NAA    EKG   Radiology No results found.  Procedures Procedures (including critical care time)  Medications Ordered in UC Medications - No data to display  Initial Impression / Assessment and Plan / UC Course  I have reviewed the triage vital signs and the nursing notes.  Pertinent labs & imaging results that were available during my care of the patient were reviewed by me and considered in my medical decision making (see chart for details).     Acute sinusitis Patient with 1 week or more of consistent and not improving symptoms. Negative PCR COVID test 2 days ago We will go ahead and cover for possible sinus infection at this time.  Treating with amoxicillin.  Flonase nasal spray for congestion. Follow up as needed for continued or worsening symptoms  Final Clinical Impressions(s) / UC Diagnoses   Final diagnoses:  Acute non-recurrent frontal sinusitis     Discharge Instructions     Treating you for a sinus infection.  Take the medication as prescribed. COVID test pending    ED Prescriptions    Medication Sig Dispense Auth. Provider   fluticasone (FLONASE) 50 MCG/ACT nasal spray Place 2 sprays into both nostrils daily. 16 g Damarco Keysor A, NP   amoxicillin-clavulanate (AUGMENTIN) 875-125 MG tablet Take 1 tablet by mouth every 12 (twelve) hours. 14 tablet Analuisa Tudor A, NP     PDMP not reviewed this encounter.   Janace Aris, NP 10/22/20 [phone]

## 2020-10-22 NOTE — ED Triage Notes (Signed)
Pt c/o congestion, loss of taste/smell onset 01/14, with negative COVID test two days ago.  Denies cough, SOB, fever, n/v/d. Has taken nyquil w/o relief of symptoms Wants re-testing

## 2020-10-24 LAB — NOVEL CORONAVIRUS, NAA: SARS-CoV-2, NAA: NOT DETECTED

## 2020-10-24 LAB — SARS-COV-2, NAA 2 DAY TAT

## 2020-11-23 ENCOUNTER — Telehealth: Payer: Self-pay | Admitting: Pulmonary Disease

## 2020-11-23 NOTE — Telephone Encounter (Signed)
Called and spoke to patient.  Patient stated that Brett Guerrero needs a PA. I have attempted to contact [employer] to start PA and their office is currently close for president day.  Will call on 11/24/2020

## 2020-11-24 NOTE — Telephone Encounter (Signed)
Lm for x2 for patient.  Will attempt to call once more due to nature of call.

## 2020-11-24 NOTE — Telephone Encounter (Signed)
Lm for patient.  

## 2020-11-24 NOTE — Telephone Encounter (Signed)
Spoke to [place] with [facility] and started PA for Breo 200.  PA has been approved through 10/25/21. Melissa with [facility] is aware of approval, however copay is still over 300 dollars.  Lm for patient. Patient will need to contact insurance company and request a list of cheaper alternatives.

## 2020-11-24 NOTE — Telephone Encounter (Signed)
Pt calling to check on status of PA for Breo. States it is an emergency b/c he is in pain. Please advise.

## 2020-11-25 NOTE — Telephone Encounter (Signed)
Spoke to patient and relayed below recommendations.  He will contact insurance for a list of alternatives.  Patient stated that he was having some shortness of breath yesterday but that is much better today. He denied pain or additional sx.  Will await call back.

## 2020-11-27 ENCOUNTER — Telehealth: Payer: Self-pay | Admitting: Pulmonary Disease

## 2020-11-27 NOTE — Telephone Encounter (Signed)
Lm for patient.  

## 2020-11-30 ENCOUNTER — Telehealth: Payer: Self-pay | Admitting: Pulmonary Disease

## 2020-11-30 MED ORDER — BREO ELLIPTA 200-25 MCG/INH IN AEPB: 1.0000 | INHALATION_SPRAY | Freq: Every day | RESPIRATORY_TRACT | 3 refills | Status: DC

## 2020-11-30 NOTE — Telephone Encounter (Signed)
Lm x2 for patient.  Will close encounter per office protocol.   

## 2020-11-30 NOTE — Telephone Encounter (Signed)
90 day supply of Breo 200 has been sent to [facility] per patient request.  Patient is aware and voiced his understanding.  Nothing further needed at this time.

## 2021-02-15 MED ORDER — BREO ELLIPTA 200-25 MCG/INH IN AEPB
1.0000 | INHALATION_SPRAY | Freq: Every day | RESPIRATORY_TRACT | 0 refills | Status: DC
Start: 2021-02-15 — End: 2022-06-24

## 2021-02-23 ENCOUNTER — Encounter: Payer: Self-pay | Admitting: Emergency Medicine

## 2021-02-23 ENCOUNTER — Ambulatory Visit (INDEPENDENT_AMBULATORY_CARE_PROVIDER_SITE_OTHER): Payer: BC Managed Care – PPO | Admitting: Emergency Medicine

## 2021-02-23 ENCOUNTER — Other Ambulatory Visit: Payer: Self-pay

## 2021-02-23 VITALS — BP 134/80 | HR 70 | Temp 97.7°F | Ht 68.0 in | Wt 258.0 lb

## 2021-02-23 DIAGNOSIS — Z1329 Encounter for screening for other suspected endocrine disorder: Secondary | ICD-10-CM

## 2021-02-23 DIAGNOSIS — Z1211 Encounter for screening for malignant neoplasm of colon: Secondary | ICD-10-CM | POA: Diagnosis not present

## 2021-02-23 DIAGNOSIS — Z130 Encounter for screening for diseases of the blood and blood-forming organs and certain disorders involving the immune mechanism: Secondary | ICD-10-CM

## 2021-02-23 DIAGNOSIS — Z13228 Encounter for screening for other metabolic disorders: Secondary | ICD-10-CM

## 2021-02-23 DIAGNOSIS — Z8709 Personal history of other diseases of the respiratory system: Secondary | ICD-10-CM

## 2021-02-23 DIAGNOSIS — Z0000 Encounter for general adult medical examination without abnormal findings: Secondary | ICD-10-CM | POA: Diagnosis not present

## 2021-02-23 DIAGNOSIS — Z9109 Other allergy status, other than to drugs and biological substances: Secondary | ICD-10-CM

## 2021-02-23 DIAGNOSIS — Z13220 Encounter for screening for lipoid disorders: Secondary | ICD-10-CM

## 2021-02-23 LAB — COMPREHENSIVE METABOLIC PANEL
ALT: 12 U/L (ref 0–53)
AST: 13 U/L (ref 0–37)
Albumin: 3.8 g/dL (ref 3.5–5.2)
Alkaline Phosphatase: 65 U/L (ref 39–117)
BUN: 9 mg/dL (ref 6–23)
CO2: 28 mEq/L (ref 19–32)
Calcium: 9.2 mg/dL (ref 8.4–10.5)
Chloride: 106 mEq/L (ref 96–112)
Creatinine, Ser: 0.7 mg/dL (ref 0.40–1.50)
GFR: 105.28 mL/min (ref >60.00)
Glucose, Bld: 105 mg/dL — ABNORMAL HIGH (ref 70–99)
Potassium: 4.6 mEq/L (ref 3.5–5.1)
Sodium: 142 mEq/L (ref 135–145)
Total Bilirubin: 0.6 mg/dL (ref 0.2–1.2)
Total Protein: 7.3 g/dL (ref 6.0–8.3)

## 2021-02-23 LAB — CBC WITH DIFFERENTIAL/PLATELET
Basophils Absolute: 0 10*3/uL (ref 0.0–0.1)
Basophils Relative: 0.3 % (ref 0.0–3.0)
Eosinophils Absolute: 0.3 10*3/uL (ref 0.0–0.7)
Eosinophils Relative: 5.2 % — ABNORMAL HIGH (ref 0.0–5.0)
HCT: 41.3 % (ref 39.0–52.0)
Hemoglobin: 13.8 g/dL (ref 13.0–17.0)
Lymphocytes Relative: 45.4 % (ref 12.0–46.0)
Lymphs Abs: 2.3 10*3/uL (ref 0.7–4.0)
MCHC: 33.5 g/dL (ref 30.0–36.0)
MCV: 90.3 fl (ref 78.0–100.0)
Monocytes Absolute: 0.5 10*3/uL (ref 0.1–1.0)
Monocytes Relative: 9.9 % (ref 3.0–12.0)
Neutro Abs: 1.9 10*3/uL (ref 1.4–7.7)
Neutrophils Relative %: 39.2 % — ABNORMAL LOW (ref 43.0–77.0)
Platelets: 188 10*3/uL (ref 150.0–400.0)
RBC: 4.57 Mil/uL (ref 4.22–5.81)
RDW: 13.8 % (ref 11.5–15.5)
WBC: 5 10*3/uL (ref 4.0–10.5)

## 2021-02-23 LAB — HEMOGLOBIN A1C: Hgb A1c MFr Bld: 5.9 % (ref 4.6–6.5)

## 2021-02-23 LAB — LIPID PANEL
Cholesterol: 151 mg/dL (ref 0–200)
HDL: 38 mg/dL — ABNORMAL LOW (ref >39.00)
LDL Cholesterol: 103 mg/dL — ABNORMAL HIGH (ref 0–99)
NonHDL: 113.28
Total CHOL/HDL Ratio: 4
Triglycerides: 53 mg/dL (ref 0.0–149.0)
VLDL: 10.6 mg/dL (ref 0.0–40.0)

## 2021-02-23 LAB — PSA: PSA: 1.53 ng/mL (ref 0.10–4.00)

## 2021-02-23 NOTE — Progress Notes (Signed)
Brett Guerrero 55 y.o.   Chief Complaint  Patient presents with  . Annual Exam    HISTORY OF PRESENT ILLNESS: This is a 55 y.o. male here for annual exam. Last year he was diagnosed with asthma.  Possibly allergic related.  Seen by Dr. Jayme Cloud, pulmonary doctor.  Office visit notes reviewed. Was started on [facility] which is helping.  PFTs still pending scheduling. Otherwise doing well.  No other complaints or medical concerns. And normal colonoscopy in 2016. Recent pulmonary doctor's office visit as follows:  Assessment & Plan:        ICD-10-CM    1. Uncomplicated asthma, unspecified asthma severity, unspecified whether persistent  J45.909      Suspect the patient has developed asthma Will obtain PFTs CBC with differential and allergen panel will be obtained Trial of Breo Ellipta  2. Gastroesophageal reflux disease, unspecified whether esophagitis present  K21.9      This is likely aggravating his asthma Trial of Nexium Antireflux measures  3. Shortness of breath  R06.02 Pulmonary Function Test [facility] Only      Allergy Panel 11, [facility]      Allergen Panel (27) + IGE      CBC w/Diff      CANCELED: CBC w/Diff      CANCELED: Allergen Panel (27) + IGE    Likely related to asthma as above PFTs as noted     HPI   Prior to Admission medications   Medication Sig Start Date End Date Taking? Authorizing Provider  albuterol (VENTOLIN HFA) 108 (90 Base) MCG/ACT inhaler Inhale into the lungs. 06/07/20   [provider]  esomeprazole (NEXIUM) 40 MG capsule Take 1 capsule (40 mg total) by mouth daily. 08/06/20   Salena Saner, MD  fluticasone (FLONASE) 50 MCG/ACT nasal spray Place 2 sprays into both nostrils daily. 10/22/20   Bast, Gloris Manchester A, NP  fluticasone furoate-vilanterol (BREO ELLIPTA) 200-25 MCG/INH AEPB Inhale 1 puff into the lungs daily. 02/15/21   Salena Saner, MD  promethazine (PHENERGAN) 25 MG tablet Take 1 tablet (25 mg total) by mouth every 8 (eight)  hours as needed for nausea (headache). Patient not taking: Reported on 10/11/2018 05/07/17 10/22/20  Sharyn Creamer, MD    No Known Allergies  There are no problems to display for this patient.   Past Medical History:  Diagnosis Date  . Sciatica of left side     History reviewed. No pertinent surgical history.  Social History   Socioeconomic History  . Marital status: Married    Spouse name: Not on file  . Number of children: Not on file  . Years of education: Not on file  . Highest education level: Not on file  Occupational History  . Not on file  Tobacco Use  . Smoking status: Never Smoker  . Smokeless tobacco: Never Used  Vaping Use  . Vaping Use: Never used  Substance and Sexual Activity  . Alcohol use: No  . Drug use: No  . Sexual activity: Not on file  Other Topics Concern  . Not on file  Social History Narrative  . Not on file   Social Determinants of Health   Financial Resource Strain: Not on file  Food Insecurity: Not on file  Transportation Needs: Not on file  Physical Activity: Not on file  Stress: Not on file  Social Connections: Not on file  Intimate Partner Violence: Not on file    Family History  Problem Relation Age of Onset  .  Cancer Mother   . Diabetes Mother   . Hyperlipidemia Father      Review of Systems  Constitutional: Negative.  Negative for chills and fever.  HENT: Negative.  Negative for congestion and sore throat.   Eyes: Negative.   Respiratory: Positive for wheezing (Occasional asthma episodes). Negative for cough and shortness of breath.   Cardiovascular: Negative.  Negative for chest pain and palpitations.  Gastrointestinal: Negative.  Negative for abdominal pain, blood in stool, diarrhea, melena, nausea and vomiting.  Genitourinary: Negative.  Negative for hematuria.  Musculoskeletal: Positive for joint pain (Chronic left shoulder pain). Negative for myalgias and neck pain.  Skin: Negative.  Negative for rash.  Neurological:  Negative.  Negative for dizziness and headaches.  Endo/Heme/Allergies: Positive for environmental allergies.  All other systems reviewed and are negative.   Today's Vitals   02/23/21 0808  BP: 140/86  Pulse: 70  Temp: 97.7 F (36.5 C)  TempSrc: Oral  SpO2: 97%  Weight: 258 lb (117 kg)  Height: 5\' 8"  (1.727 m)   Body mass index is 39.23 kg/m. Wt Readings from Last 3 Encounters:  02/23/21 258 lb (117 kg)  10/22/20 240 lb (108.9 kg)  08/06/20 256 lb 6.4 oz (116.3 kg)   The 10-year ASCVD risk score 13/04/21 DC Jr., et al., 2013) is: 7.1%   Values used to calculate the score:     Age: 73 years     Sex: Male     Is Non-Hispanic African American: Yes     Diabetic: No     Tobacco smoker: No     Systolic Blood Pressure: 140 mmHg     Is BP treated: No     HDL Cholesterol: 41 mg/dL     Total Cholesterol: 157 mg/dL  Physical Exam Vitals reviewed.  Constitutional:      Appearance: Normal appearance.  HENT:     Head: Normocephalic.     Right Ear: Tympanic membrane, ear canal and external ear normal.     Left Ear: Tympanic membrane, ear canal and external ear normal.  Eyes:     Extraocular Movements: Extraocular movements intact.     Conjunctiva/sclera: Conjunctivae normal.     Pupils: Pupils are equal, round, and reactive to light.  Cardiovascular:     Rate and Rhythm: Normal rate and regular rhythm.     Pulses: Normal pulses.     Heart sounds: Normal heart sounds.  Pulmonary:     Effort: Pulmonary effort is normal.     Breath sounds: Normal breath sounds.  Abdominal:     General: Bowel sounds are normal. There is no distension.     Palpations: Abdomen is soft. There is no mass.     Tenderness: There is no abdominal tenderness. There is no right CVA tenderness or left CVA tenderness.  Musculoskeletal:        General: Normal range of motion.     Cervical back: Normal range of motion and neck supple.  Skin:    General: Skin is warm and dry.     Capillary Refill: Capillary  refill takes less than 2 seconds.  Neurological:     General: No focal deficit present.     Mental Status: He is alert and oriented to person, place, and time.  Psychiatric:        Mood and Affect: Mood normal.        Behavior: Behavior normal.      ASSESSMENT & PLAN: Japheth was seen today for annual exam.  Diagnoses and all orders for this visit:  Routine general medical examination at a health care facility  Colon cancer screening -     Ambulatory referral to Gastroenterology  Screening for deficiency anemia -     CBC with Differential/Platelet  Screening for lipoid disorders -     Lipid panel  Screening for endocrine, metabolic and immunity disorder -     Comprehensive metabolic panel -     Hemoglobin A1c -     Hepatitis C antibody -     PSA  History of asthma  History of environmental allergies   Modifiable risk factors discussed with patient. Anticipatory guidance according to age provided. The following topics were discussed: Smoking Diet and nutrition Benefits of exercise Cancer screening and need for colon cancer screening with colonoscopy Vaccinations Cardiovascular risk assessment Mental health including depression and anxiety Fall and accident prevention   Patient Instructions     Health Maintenance, Male Adopting a healthy lifestyle and getting preventive care are important in promoting health and wellness. Ask your health care provider about:  The right schedule for you to have regular tests and exams.  Things you can do on your own to prevent diseases and keep yourself healthy. What should I know about diet, weight, and exercise? Eat a healthy diet  Eat a diet that includes plenty of vegetables, fruits, low-fat dairy products, and lean protein.  Do not eat a lot of foods that are high in solid fats, added sugars, or sodium.   Maintain a healthy weight Body mass index (BMI) is a measurement that can be used to identify possible weight  problems. It estimates body fat based on height and weight. Your health care provider can help determine your BMI and help you achieve or maintain a healthy weight. Get regular exercise Get regular exercise. This is one of the most important things you can do for your health. Most adults should:  Exercise for at least 150 minutes each week. The exercise should increase your heart rate and make you sweat (moderate-intensity exercise).  Do strengthening exercises at least twice a week. This is in addition to the moderate-intensity exercise.  Spend less time sitting. Even light physical activity can be beneficial. Watch cholesterol and blood lipids Have your blood tested for lipids and cholesterol at 54 years of age, then have this test every 5 years. You may need to have your cholesterol levels checked more often if:  Your lipid or cholesterol levels are high.  You are older than 54 years of age.  You are at high risk for heart disease. What should I know about cancer screening? Many types of cancers can be detected early and may often be prevented. Depending on your health history and family history, you may need to have cancer screening at various ages. This may include screening for:  Colorectal cancer.  Prostate cancer.  Skin cancer.  Lung cancer. What should I know about heart disease, diabetes, and high blood pressure? Blood pressure and heart disease  High blood pressure causes heart disease and increases the risk of stroke. This is more likely to develop in people who have high blood pressure readings, are of African descent, or are overweight.  Talk with your health care provider about your target blood pressure readings.  Have your blood pressure checked: ? Every 3-5 years if you are 6318-54 years of age. ? Every year if you are 54 years old or older.  If you are between the ages of 3965  and 75 and are a current or former smoker, ask your health care provider if you should  have a one-time screening for abdominal aortic aneurysm (AAA). Diabetes Have regular diabetes screenings. This checks your fasting blood sugar level. Have the screening done:  Once every three years after age 72 if you are at a normal weight and have a low risk for diabetes.  More often and at a younger age if you are overweight or have a high risk for diabetes. What should I know about preventing infection? Hepatitis B If you have a higher risk for hepatitis B, you should be screened for this virus. Talk with your health care provider to find out if you are at risk for hepatitis B infection. Hepatitis C Blood testing is recommended for:  Everyone born from [birth year] through 1964.  Anyone with known risk factors for hepatitis C. Sexually transmitted infections (STIs)  You should be screened each year for STIs, including gonorrhea and chlamydia, if: ? You are sexually active and are younger than 54 years of age. ? You are older than 54 years of age and your health care provider tells you that you are at risk for this type of infection. ? Your sexual activity has changed since you were last screened, and you are at increased risk for chlamydia or gonorrhea. Ask your health care provider if you are at risk.  Ask your health care provider about whether you are at high risk for HIV. Your health care provider may recommend a prescription medicine to help prevent HIV infection. If you choose to take medicine to prevent HIV, you should first get tested for HIV. You should then be tested every 3 months for as long as you are taking the medicine. Follow these instructions at home: Lifestyle  Do not use any products that contain nicotine or tobacco, such as cigarettes, e-cigarettes, and chewing tobacco. If you need help quitting, ask your health care provider.  Do not use street drugs.  Do not share needles.  Ask your health care provider for help if you need support or information about quitting  drugs. Alcohol use  Do not drink alcohol if your health care provider tells you not to drink.  If you drink alcohol: ? Limit how much you have to 0-2 drinks a day. ? Be aware of how much alcohol is in your drink. In the [place] one drink equals one 12 oz bottle of beer (355 mL), one 5 oz glass of wine (148 mL), or one 1 oz glass of hard liquor (44 mL). General instructions  Schedule regular health, dental, and eye exams.  Stay current with your vaccines.  Tell your health care provider if: ? You often feel depressed. ? You have ever been abused or do not feel safe at home. Summary  Adopting a healthy lifestyle and getting preventive care are important in promoting health and wellness.  Follow your health care provider's instructions about healthy diet, exercising, and getting tested or screened for diseases.  Follow your health care provider's instructions on monitoring your cholesterol and blood pressure. This information is not intended to replace advice given to you by your health care provider. Make sure you discuss any questions you have with your health care provider. Document Revised: 09/12/2018 Document Reviewed: 09/12/2018 Elsevier Patient Education  2021 [employer]vier Inc.     Edwina Barth, MD [facility]

## 2021-02-23 NOTE — Patient Instructions (Signed)

## 2021-02-24 LAB — HEPATITIS C ANTIBODY
Hepatitis C Ab: NONREACTIVE
SIGNAL TO CUT-OFF: 0.01 (ref <1.00)

## 2021-04-02 ENCOUNTER — Other Ambulatory Visit: Payer: Self-pay | Admitting: Emergency Medicine

## 2021-04-02 ENCOUNTER — Encounter: Payer: Self-pay | Admitting: Emergency Medicine

## 2021-04-02 MED ORDER — SILDENAFIL CITRATE 100 MG PO TABS: 50.0000 mg | ORAL_TABLET | Freq: Every day | ORAL | 11 refills | Status: DC | PRN

## 2021-06-25 ENCOUNTER — Encounter: Payer: Self-pay | Admitting: Internal Medicine

## 2021-06-26 ENCOUNTER — Ambulatory Visit: Admission: EM | Admit: 2021-06-26 | Payer: BC Managed Care – PPO | Source: Home / Self Care

## 2021-07-27 ENCOUNTER — Ambulatory Visit (INDEPENDENT_AMBULATORY_CARE_PROVIDER_SITE_OTHER): Payer: BC Managed Care – PPO | Admitting: Internal Medicine

## 2021-07-27 ENCOUNTER — Encounter: Payer: Self-pay | Admitting: Internal Medicine

## 2021-07-27 ENCOUNTER — Other Ambulatory Visit: Payer: Self-pay

## 2021-07-27 VITALS — BP 130/88 | HR 76 | Ht 68.0 in | Wt 261.0 lb

## 2021-07-27 DIAGNOSIS — Z1211 Encounter for screening for malignant neoplasm of colon: Secondary | ICD-10-CM | POA: Diagnosis not present

## 2021-07-27 DIAGNOSIS — K219 Gastro-esophageal reflux disease without esophagitis: Secondary | ICD-10-CM

## 2021-07-27 DIAGNOSIS — Z800 Family history of malignant neoplasm of digestive organs: Secondary | ICD-10-CM | POA: Diagnosis not present

## 2021-07-27 MED ORDER — ESOMEPRAZOLE MAGNESIUM 40 MG PO CPDR: 40.0000 mg | DELAYED_RELEASE_CAPSULE | Freq: Every day | ORAL | 3 refills | Status: DC

## 2021-07-27 MED ORDER — SUTAB 1479-225-188 MG PO TABS: 1.0000 | ORAL_TABLET | Freq: Once | ORAL | 0 refills | Status: AC

## 2021-07-27 NOTE — Progress Notes (Signed)
Chief Complaint: Colon cancer screening  HPI :  55 year old male with history of GERD, hiatal hernia, obesity, and bronchitis presents for colon cancer screening.  Last colonoscopy was in 2016 in [place], [place] (this was his first colonoscopy) that had no polyps. Had EGD at the same time that showed a hiatal hernia. He states that he opted for 5 year colon cancer screening intervals at that time. Denies rectal bleeding, melena, unintentional weight loss, changes in bowel habits. Last month his brother was diagnosed with colon cancer in his late 17s. Patient has chest burning that has been present for years. This chest burning was what led him to get an EGD in the past. He was started on Nexium, which resolved his chest burning. He took a break from the Nexium but is back on it due to some recurrent issues with bronchitis, which has been attributed to GERD in the past. If he eats particularly spicy foods, he will have heartburn symptoms. Breakthrough symptoms on a monthly basis. He does not take his Nexium at a specific time during the day. He does not eat breakfast. Denies N&V or dysphagia. He follows a pescatarian diet. Denies prior surgeries in abdomen.  He works as a TEFL teacher at [employer]   Past Medical History:  Diagnosis Date   Asthma    GERD (gastroesophageal reflux disease)    Sciatica of left side      History reviewed. No pertinent surgical history. Family History  Problem Relation Age of Onset   Cancer Mother    Diabetes Mother    Hyperlipidemia Father    Colon cancer Brother    Social History   Tobacco Use   Smoking status: Never   Smokeless tobacco: Never  Vaping Use   Vaping Use: Never used  Substance Use Topics   Alcohol use: No   Drug use: No   Current Outpatient Medications  Medication Sig Dispense Refill   albuterol (VENTOLIN HFA) 108 (90 Base) MCG/ACT inhaler Inhale into the lungs.     esomeprazole (NEXIUM) 40 MG capsule Take 1  capsule (40 mg total) by mouth daily. 30 capsule 3   fluticasone (FLONASE) 50 MCG/ACT nasal spray Place 2 sprays into both nostrils daily. 16 g 2   fluticasone furoate-vilanterol (BREO ELLIPTA) 200-25 MCG/INH AEPB Inhale 1 puff into the lungs daily. 180 each 0   sildenafil (VIAGRA) 100 MG tablet Take 0.5-1 tablets (50-100 mg total) by mouth daily as needed for erectile dysfunction. 5 tablet 11   No current facility-administered medications for this visit.   No Known Allergies   Review of Systems: All systems reviewed and negative except where noted in HPI.   Physical Exam: Ht 5\' 8"  (1.727 m)   Wt 261 lb (118.4 kg)   BMI 39.68 kg/m  Constitutional: Pleasant,well-developed, male in no acute distress. HEENT: Normocephalic and atraumatic. Conjunctivae are normal. No scleral icterus. Cardiovascular: Normal rate, regular rhythm.  Pulmonary/chest: Effort normal and breath sounds normal. No wheezing, rales or rhonchi. Abdominal: Soft, nondistended, nontender. Bowel sounds active throughout. Umbilical hernia Extremities: No edema Neurological: Alert and oriented to person place and time. Skin: Skin is warm and dry. No rashes noted. Psychiatric: Normal mood and affect. Behavior is normal.  Labs 01/2021: CBC unremarkable, CMP unremarkable, HCV Ab neg  ASSESSMENT AND PLAN: Colon cancer screening Fam hx of colon cancer GERD Hiatal hernia Patient presents for colon cancer screening. His brother was recently diagnosed with colon cancer in his late 27s. Thus  the patient will need colonoscopies every 5 years for high risk colon cancer screening. His last colonoscopy in 2015 (confirmed over the phone with his prior gastroenterologist) was normal. Today he also describes some issues with possible aspiration that has been attributed to uncontrolled GERD in the past. I offered the patient the option of increasing his PPI from QD to BID to try to help with his symptoms versus optimizing his diet/timing  of PPI. Patient wanted to try dietary changes and taking his PPI before dinnertime to see if this would help with his symptoms. He will call me for an appointment if he has any further GI issues. - Encourage GERD diet - Counseled on taking PPI 30 min to 1 hour before eating dinner - Colonoscopy [facility] - RTC PRN  [name], MD

## 2021-07-27 NOTE — Patient Instructions (Addendum)
If you are age 54 or older, your body mass index should be between 23-30. Your Body mass index is 39.68 kg/m. If this is out of the aforementioned range listed, please consider follow up with your Primary Care Provider.  If you are age 65 or younger, your body mass index should be between 19-25. Your Body mass index is 39.68 kg/m. If this is out of the aformentioned range listed, please consider follow up with your Primary Care Provider.   ________________________________________________________  The [facility] providers would like to encourage you to use [facility] to communicate with providers for non-urgent requests or questions.  Due to long hold times on the telephone, sending your provider a message by [facility] may be a faster and more efficient way to get a response.  Please allow 48 business hours for a response.  Please remember that this is for non-urgent requests.  _______________________________________________________  Brett Guerrero have been scheduled for a colonoscopy. Please follow written instructions given to you at your visit today.  Please pick up your prep supplies at the pharmacy within the next 1-3 days. If you use inhalers (even only as needed), please bring them with you on the day of your procedure.  We are giving you a GERD diet to follow.  Please take your Nexium (esomeprazole) 30 to 60 minutes before eating.  Thank you for entrusting me with your care and for choosing [facility], Dr. [name]

## 2021-08-19 ENCOUNTER — Encounter: Payer: Self-pay | Admitting: Internal Medicine

## 2021-08-29 ENCOUNTER — Encounter: Payer: Self-pay | Admitting: Certified Registered Nurse Anesthetist

## 2021-08-30 ENCOUNTER — Ambulatory Visit (AMBULATORY_SURGERY_CENTER): Payer: BC Managed Care – PPO | Admitting: Internal Medicine

## 2021-08-30 ENCOUNTER — Encounter: Payer: Self-pay | Admitting: Internal Medicine

## 2021-08-30 VITALS — BP 132/85 | HR 75 | Temp 98.4°F | Resp 23 | Ht 68.0 in | Wt 261.0 lb

## 2021-08-30 DIAGNOSIS — Z800 Family history of malignant neoplasm of digestive organs: Secondary | ICD-10-CM

## 2021-08-30 DIAGNOSIS — Z1211 Encounter for screening for malignant neoplasm of colon: Secondary | ICD-10-CM

## 2021-08-30 MED ORDER — SODIUM CHLORIDE 0.9 % IV SOLN: 500.0000 mL | Freq: Once | INTRAVENOUS | Status: DC

## 2021-08-30 NOTE — Patient Instructions (Signed)

## 2021-08-30 NOTE — Op Note (Signed)
[facility] Patient Name: Brett Guerrero Procedure Date: 08/30/2021 2:58 PM MRN: [id number] Endoscopist: Nicole Kindred "Brett Guerrero ,  Age: 55 Referring MD:  Date of Birth: 1966/06/08 Gender: Male Account #: [id number] Procedure:                Colonoscopy Indications:              Screening in patient at increased risk: Family                            history of 1st-degree relative with colorectal                            cancer before age 54 years Medicines:                Monitored Anesthesia Care Procedure:                Pre-Anesthesia Assessment:                           - Prior to the procedure, a History and Physical                            was performed, and patient medications and                            allergies were reviewed. The patient's tolerance of                            previous anesthesia was also reviewed. The risks                            and benefits of the procedure and the sedation                            options and risks were discussed with the patient.                            All questions were answered, and informed consent                            was obtained. Prior Anticoagulants: The patient has                            taken no previous anticoagulant or antiplatelet                            agents. ASA Grade Assessment: II - A patient with                            mild systemic disease. After reviewing the risks                            and benefits, the patient was deemed in  satisfactory condition to undergo the procedure.                           After obtaining informed consent, the colonoscope                            was passed under direct vision. Throughout the                            procedure, the patient's blood pressure, pulse, and                            oxygen saturations were monitored continuously. The                            Olympus CF-HQ190L [id number])  Colonoscope was                            introduced through the anus and advanced to the the                            terminal ileum. The colonoscopy was performed                            without difficulty. The patient tolerated the                            procedure well. The quality of the bowel                            preparation was adequate. Scope In: 3:27:08 PM Scope Out: 3:40:23 PM Scope Withdrawal Time: 0 hours 11 minutes 5 seconds  Total Procedure Duration: 0 hours 13 minutes 15 seconds  Findings:                 The terminal ileum appeared normal.                           A few small-mouthed diverticula were found in the                            sigmoid colon.                           Non-bleeding internal hemorrhoids were found during                            retroflexion. Complications:            No immediate complications. Estimated Blood Loss:     Estimated blood loss: none. Impression:               - The examined portion of the ileum was normal.                           - Diverticulosis in the sigmoid colon.                           -  Non-bleeding internal hemorrhoids.                           - No specimens collected. Recommendation:           - Discharge patient to home (with escort).                           - Repeat colonoscopy in 5 years.                           - The findings and recommendations were discussed                            with the patient. Nicole Kindred "Brett Guerrero,  08/30/2021 3:52:15 PM

## 2021-08-30 NOTE — Progress Notes (Signed)
VS by CW. ?

## 2021-08-30 NOTE — Progress Notes (Signed)
GASTROENTEROLOGY PROCEDURE H&P NOTE   Primary Care Physician: Georgina Quint, MD    Reason for Procedure:   High risk colon cancer screening  Plan:    EGD/colonoscopy  Patient is appropriate for endoscopic procedure(s) in the ambulatory ([facility]) setting.  The nature of the procedure, as well as the risks, benefits, and alternatives were carefully and thoroughly reviewed with the patient. Ample time for discussion and questions allowed. The patient understood, was satisfied, and agreed to proceed.     HPI: Brett Guerrero is a 55 y.o. male who presents for EGD/colonoscopy for evaluation of colon cancer screening (high risk) .  Patient was most recently seen in the Gastroenterology Clinic on 07/27/21.  No interval change in medical history since that appointment. Please refer to that note for full details regarding GI history and clinical presentation.   Past Medical History:  Diagnosis Date   Asthma    Family history of colon cancer    brother - 59s   GERD (gastroesophageal reflux disease)    Sciatica of left side     History reviewed. No pertinent surgical history.  Prior to Admission medications   Medication Sig Start Date End Date Taking? Authorizing Provider  albuterol (VENTOLIN HFA) 108 (90 Base) MCG/ACT inhaler Inhale into the lungs. Patient not taking: Reported on 08/30/2021 06/07/20   [provider]  esomeprazole (NEXIUM) 40 MG capsule Take 1 capsule (40 mg total) by mouth daily. Take 30 to 60 minutes before a meal 07/27/21   Imogene Burn, MD  fluticasone [facility]) 50 MCG/ACT nasal spray Place 2 sprays into both nostrils daily. 10/22/20   Bast, Gloris Manchester A, NP  fluticasone furoate-vilanterol (BREO ELLIPTA) 200-25 MCG/INH AEPB Inhale 1 puff into the lungs daily. 02/15/21   Salena Saner, MD  sildenafil (VIAGRA) 100 MG tablet Take 0.5-1 tablets (50-100 mg total) by mouth daily as needed for erectile dysfunction. 04/02/21   Georgina Quint, MD   promethazine (PHENERGAN) 25 MG tablet Take 1 tablet (25 mg total) by mouth every 8 (eight) hours as needed for nausea (headache). Patient not taking: Reported on 10/11/2018 05/07/17 10/22/20  Sharyn Creamer, MD    Current Outpatient Medications  Medication Sig Dispense Refill   albuterol (VENTOLIN HFA) 108 (90 Base) MCG/ACT inhaler Inhale into the lungs. (Patient not taking: Reported on 08/30/2021)     esomeprazole (NEXIUM) 40 MG capsule Take 1 capsule (40 mg total) by mouth daily. Take 30 to 60 minutes before a meal 30 capsule 3   fluticasone (FLONASE) 50 MCG/ACT nasal spray Place 2 sprays into both nostrils daily. 16 g 2   fluticasone furoate-vilanterol (BREO ELLIPTA) 200-25 MCG/INH AEPB Inhale 1 puff into the lungs daily. 180 each 0   sildenafil (VIAGRA) 100 MG tablet Take 0.5-1 tablets (50-100 mg total) by mouth daily as needed for erectile dysfunction. 5 tablet 11   Current Facility-Administered Medications  Medication Dose Route Frequency Provider Last Rate Last Admin   0.9 %  sodium chloride infusion  500 mL Intravenous Once Imogene Burn, MD        Allergies as of 08/30/2021   (No Known Allergies)    Family History  Problem Relation Age of Onset   Cancer Mother    Diabetes Mother    Hyperlipidemia Father    Colon cancer Brother 1   Esophageal cancer Neg Hx    Stomach cancer Neg Hx    Rectal cancer Neg Hx     Social History   Socioeconomic History  Marital status: Married    Spouse name: Not on file   Number of children: Not on file   Years of education: Not on file   Highest education level: Not on file  Occupational History   Not on file  Tobacco Use   Smoking status: Never   Smokeless tobacco: Never  Vaping Use   Vaping Use: Never used  Substance and Sexual Activity   Alcohol use: No   Drug use: No   Sexual activity: Not on file  Other Topics Concern   Not on file  Social History Narrative   Not on file   Social Determinants of Health   Financial  Resource Strain: Not on file  Food Insecurity: Not on file  Transportation Needs: Not on file  Physical Activity: Not on file  Stress: Not on file  Social Connections: Not on file  Intimate Partner Violence: Not on file    Physical Exam: Vital signs in last 24 hours: BP 111/68   Pulse 97   Temp 98.4 F (36.9 C)   Ht 5\' 8"  (1.727 m)   Wt 261 lb (118.4 kg)   SpO2 99%   BMI 39.68 kg/m  GEN: NAD EYE: Sclerae anicteric ENT: MMM CV: Non-tachycardic Pulm: No increased WOB GI: Soft NEURO:  Alert & Oriented   , MD [facility]   08/30/2021 3:10 PM

## 2021-08-30 NOTE — Progress Notes (Signed)
Report given to PACU, vss 

## 2021-09-01 ENCOUNTER — Telehealth: Payer: Self-pay | Admitting: *Deleted

## 2021-09-01 NOTE — Telephone Encounter (Signed)
First attempt, left VM.  

## 2021-09-01 NOTE — Telephone Encounter (Signed)
  Follow up Call-  Call back number 08/30/2021  Post procedure Call Back phone  # [phone]  Permission to leave phone message Yes  Some recent data might be hidden   LMOM to call back with any questions or concerns.  Also, call back if patient has developed fever, respiratory issues or been dx with COVID or had any family members or close contacts diagnosed since her procedure.

## 2021-10-26 ENCOUNTER — Encounter: Payer: Self-pay | Admitting: Emergency Medicine

## 2021-10-26 ENCOUNTER — Other Ambulatory Visit: Payer: Self-pay | Admitting: Emergency Medicine

## 2021-10-26 MED ORDER — ESOMEPRAZOLE MAGNESIUM 40 MG PO CPDR: 40.0000 mg | DELAYED_RELEASE_CAPSULE | Freq: Every day | ORAL | 3 refills | Status: DC

## 2021-10-26 NOTE — Telephone Encounter (Signed)
New prescription sent to pharmacy of record.  Thanks.

## 2021-11-12 ENCOUNTER — Encounter: Payer: Self-pay | Admitting: Emergency Medicine

## 2021-11-15 ENCOUNTER — Ambulatory Visit: Payer: Self-pay

## 2021-11-16 ENCOUNTER — Other Ambulatory Visit: Payer: Self-pay | Admitting: Emergency Medicine

## 2021-11-16 MED ORDER — FLUTICASONE PROPIONATE 50 MCG/ACT NA SUSP: 2.0000 | Freq: Every day | NASAL | 2 refills | Status: DC

## 2021-11-16 NOTE — Telephone Encounter (Signed)
New prescription for fluticasone sent to pharmacy of record.  It is safe to take it.  Thanks.

## 2021-12-16 ENCOUNTER — Encounter: Payer: Self-pay | Admitting: Emergency Medicine

## 2022-02-15 ENCOUNTER — Ambulatory Visit
Admission: RE | Admit: 2022-02-15 | Discharge: 2022-02-15 | Disposition: A | Discharge status: Home / Self Care | Payer: 59 | Source: Ambulatory Visit | Attending: Emergency Medicine | Admitting: Emergency Medicine

## 2022-02-15 VITALS — BP 130/78 | HR 97 | Temp 98.6°F | Resp 18

## 2022-02-15 DIAGNOSIS — J020 Streptococcal pharyngitis: Secondary | ICD-10-CM | POA: Diagnosis not present

## 2022-02-15 LAB — POCT RAPID STREP A (OFFICE): Rapid Strep A Screen: POSITIVE — AB

## 2022-02-15 MED ORDER — AMOXICILLIN 500 MG PO CAPS: 500.0000 mg | ORAL_CAPSULE | Freq: Two times a day (BID) | ORAL | 0 refills | Status: AC

## 2022-02-15 NOTE — ED Provider Notes (Signed)
?[facility] ? ? ? ?CSN: [id number] ?Arrival date & time: 02/15/22  0865 ? ? ?  ? ?History   ?Chief Complaint ?Chief Complaint  ?Patient presents with  ? Chills  ?  Chills, sore throat, fatigue, excessive urinating, light sweating, lower body soreness. tem 64f - 19f. - Entered by patient  ? Sore Throat  ? Fatigue  ? Generalized Body Aches  ? ? ?HPI ?Brett Guerrero is a 56 y.o. male.  Patient presents with chills, body aches, fatigue, sore throat x1 day.  His wife has strep throat.  He denies fever, rash, cough, shortness of breath, vomiting, diarrhea, or other symptoms.  No treatments at home.  Medical history includes asthma. ? ?The history is provided by the patient and medical records.  ? ?Past Medical History:  ?Diagnosis Date  ? Asthma   ? Family history of colon cancer   ? brother - 53s  ? GERD (gastroesophageal reflux disease)   ? Sciatica of left side   ? ? ?There are no problems to display for this patient. ? ? ?History reviewed. No pertinent surgical history. ? ? ? ? ?Home Medications   ? ?Prior to Admission medications   ?Medication Sig Start Date End Date Taking? Authorizing Provider  ?amoxicillin (AMOXIL) 500 MG capsule Take 1 capsule (500 mg total) by mouth 2 (two) times daily for 10 days. 02/15/22 02/25/22 Yes Mickie Bail, NP  ?albuterol (VENTOLIN HFA) 108 (90 Base) MCG/ACT inhaler Inhale into the lungs. ?Patient not taking: Reported on 08/30/2021 06/07/20   [provider]  ?esomeprazole (NEXIUM) 40 MG capsule Take 1 capsule (40 mg total) by mouth daily. Take 30 to 60 minutes before a meal 10/26/21   Sagardia, Eilleen Kempf, MD  ?fluticasone [facility]) 50 MCG/ACT nasal spray Place 2 sprays into both nostrils daily. 11/16/21   Georgina Quint, MD  ?fluticasone furoate-vilanterol (BREO ELLIPTA) 200-25 MCG/INH AEPB Inhale 1 puff into the lungs daily. 02/15/21   Salena Saner, MD  ?sildenafil (VIAGRA) 100 MG tablet Take 0.5-1 tablets (50-100 mg total) by mouth daily as needed for  erectile dysfunction. 04/02/21   Georgina Quint, MD  ?promethazine (PHENERGAN) 25 MG tablet Take 1 tablet (25 mg total) by mouth every 8 (eight) hours as needed for nausea (headache). ?Patient not taking: Reported on 10/11/2018 05/07/17 10/22/20  Sharyn Creamer, MD  ? ? ?Family History ?Family History  ?Problem Relation Age of Onset  ? Cancer Mother   ? Diabetes Mother   ? Hyperlipidemia Father   ? Colon cancer Brother 58  ? Esophageal cancer Neg Hx   ? Stomach cancer Neg Hx   ? Rectal cancer Neg Hx   ? ? ?Social History ?Social History  ? ?Tobacco Use  ? Smoking status: Never  ? Smokeless tobacco: Never  ?Vaping Use  ? Vaping Use: Never used  ?Substance Use Topics  ? Alcohol use: No  ? Drug use: No  ? ? ? ?Allergies   ?Patient has no known allergies. ? ? ?Review of Systems ?Review of Systems  ?Constitutional:  Positive for chills and fatigue. Negative for fever.  ?HENT:  Positive for sore throat. Negative for ear pain.   ?Respiratory:  Negative for cough and shortness of breath.   ?Gastrointestinal:  Negative for diarrhea and vomiting.  ?Skin:  Negative for color change and rash.  ?All other systems reviewed and are negative. ? ? ?Physical Exam ?Triage Vital Signs ?ED Triage Vitals  ?Enc Vitals Group  ?  BP   ?   Pulse   ?   Resp   ?   Temp   ?   Temp src   ?   SpO2   ?   Weight   ?   Height   ?   Head Circumference   ?   Peak Flow   ?   Pain Score   ?   Pain Loc   ?   Pain Edu?   ?   Excl. in GC?   ? ?No data found. ? ?Updated Vital Signs ?BP 130/78   Pulse 97   Temp 98.6 ?F (37 ?C)   Resp 18   SpO2 97%  ? ?Visual Acuity ?Right Eye Distance:   ?Left Eye Distance:   ?Bilateral Distance:   ? ?Right Eye Near:   ?Left Eye Near:    ?Bilateral Near:    ? ?Physical Exam ?Vitals and nursing note reviewed.  ?Constitutional:   ?   General: He is not in acute distress. ?   Appearance: Normal appearance. He is well-developed. He is not ill-appearing.  ?HENT:  ?   Right Ear: Tympanic membrane normal.  ?   Left Ear: Tympanic  membrane normal.  ?   Nose: Nose normal.  ?   Mouth/Throat:  ?   Mouth: Mucous membranes are moist.  ?   Pharynx: Posterior oropharyngeal erythema present.  ?Cardiovascular:  ?   Rate and Rhythm: Normal rate and regular rhythm.  ?   Heart sounds: Normal heart sounds.  ?Pulmonary:  ?   Effort: Pulmonary effort is normal. No respiratory distress.  ?   Breath sounds: Normal breath sounds.  ?Musculoskeletal:  ?   Cervical back: Neck supple.  ?Skin: ?   General: Skin is warm and dry.  ?Neurological:  ?   Mental Status: He is alert.  ?Psychiatric:     ?   Mood and Affect: Mood normal.     ?   Behavior: Behavior normal.  ? ? ? ?UC Treatments / Results  ?Labs ?(all labs ordered are listed, but only abnormal results are displayed) ?Labs Reviewed  ?POCT RAPID STREP A (OFFICE) - Abnormal; Notable for the following components:  ?    Result Value  ? Rapid Strep A Screen Positive (*)   ? All other components within normal limits  ? ? ?EKG ? ? ?Radiology ?No results found. ? ?Procedures ?Procedures (including critical care time) ? ?Medications Ordered in UC ?Medications - No data to display ? ?Initial Impression / Assessment and Plan / UC Course  ?I have reviewed the triage vital signs and the nursing notes. ? ?Pertinent labs & imaging results that were available during my care of the patient were reviewed by me and considered in my medical decision making (see chart for details). ? ? Strep pharyngitis.  Rapids strep positive.  Treating with amoxicillin.  Tylenol or.  Instructed patient to follow-up with his PCP if his symptoms are not improving.  Education provided on strep throat.  Patient agrees to plan of care. ? ? ?Final Clinical Impressions(s) / UC Diagnoses  ? ?Final diagnoses:  ?Strep pharyngitis  ? ? ? ?Discharge Instructions   ? ?  ?Take the amoxicillin for strep throat.  Take Tylenol and ibuprofen for fever or discomfort.  Follow up with your primary care provider if your symptoms are not improving.   ? ? ? ? ? ?ED  Prescriptions   ? ? Medication Sig Dispense Auth. Provider  ? amoxicillin (  AMOXIL) 500 MG capsule Take 1 capsule (500 mg total) by mouth 2 (two) times daily for 10 days. 20 capsule Mickie Bail, NP  ? ?  ? ?PDMP not reviewed this encounter. ?  ?Mickie Bail, NP ?02/15/22 0919 ? ?

## 2022-02-15 NOTE — ED Triage Notes (Signed)
Patient presents to Urgent Care with complaints of sore throat, chills, fatigue, body aches since yesterday. Not treating symptoms.  ? ?

## 2022-02-15 NOTE — Discharge Instructions (Addendum)
Take the amoxicillin for strep throat.  Take Tylenol and ibuprofen for fever or discomfort.  Follow up with your primary care provider if your symptoms are not improving.   ? ?

## 2022-03-01 ENCOUNTER — Other Ambulatory Visit: Payer: Self-pay | Admitting: Emergency Medicine

## 2022-03-01 ENCOUNTER — Encounter: Payer: Self-pay | Admitting: Emergency Medicine

## 2022-03-01 ENCOUNTER — Ambulatory Visit (INDEPENDENT_AMBULATORY_CARE_PROVIDER_SITE_OTHER): Payer: 59 | Admitting: Emergency Medicine

## 2022-03-01 VITALS — BP 116/78 | HR 88 | Temp 97.9°F | Ht 68.0 in | Wt 251.2 lb

## 2022-03-01 DIAGNOSIS — Z8709 Personal history of other diseases of the respiratory system: Secondary | ICD-10-CM

## 2022-03-01 DIAGNOSIS — Z0000 Encounter for general adult medical examination without abnormal findings: Secondary | ICD-10-CM

## 2022-03-01 DIAGNOSIS — Z9109 Other allergy status, other than to drugs and biological substances: Secondary | ICD-10-CM | POA: Diagnosis not present

## 2022-03-01 DIAGNOSIS — Z125 Encounter for screening for malignant neoplasm of prostate: Secondary | ICD-10-CM

## 2022-03-01 DIAGNOSIS — Z130 Encounter for screening for diseases of the blood and blood-forming organs and certain disorders involving the immune mechanism: Secondary | ICD-10-CM | POA: Diagnosis not present

## 2022-03-01 DIAGNOSIS — Z1329 Encounter for screening for other suspected endocrine disorder: Secondary | ICD-10-CM

## 2022-03-01 DIAGNOSIS — R972 Elevated prostate specific antigen [PSA]: Secondary | ICD-10-CM

## 2022-03-01 DIAGNOSIS — Z13220 Encounter for screening for lipoid disorders: Secondary | ICD-10-CM | POA: Diagnosis not present

## 2022-03-01 DIAGNOSIS — Z8042 Family history of malignant neoplasm of prostate: Secondary | ICD-10-CM

## 2022-03-01 DIAGNOSIS — Z13228 Encounter for screening for other metabolic disorders: Secondary | ICD-10-CM | POA: Diagnosis not present

## 2022-03-01 LAB — CBC WITH DIFFERENTIAL/PLATELET
Basophils Absolute: 0 10*3/uL (ref 0.0–0.1)
Basophils Relative: 0.2 % (ref 0.0–3.0)
Eosinophils Absolute: 0.2 10*3/uL (ref 0.0–0.7)
Eosinophils Relative: 4.2 % (ref 0.0–5.0)
HCT: 39.2 % (ref 39.0–52.0)
Hemoglobin: 13.2 g/dL (ref 13.0–17.0)
Lymphocytes Relative: 47.9 % — ABNORMAL HIGH (ref 12.0–46.0)
Lymphs Abs: 2.8 10*3/uL (ref 0.7–4.0)
MCHC: 33.6 g/dL (ref 30.0–36.0)
MCV: 89.5 fl (ref 78.0–100.0)
Monocytes Absolute: 0.5 10*3/uL (ref 0.1–1.0)
Monocytes Relative: 8.1 % (ref 3.0–12.0)
Neutro Abs: 2.3 10*3/uL (ref 1.4–7.7)
Neutrophils Relative %: 39.6 % — ABNORMAL LOW (ref 43.0–77.0)
Platelets: 206 10*3/uL (ref 150.0–400.0)
RBC: 4.39 Mil/uL (ref 4.22–5.81)
RDW: 14.1 % (ref 11.5–15.5)
WBC: 5.9 10*3/uL (ref 4.0–10.5)

## 2022-03-01 LAB — COMPREHENSIVE METABOLIC PANEL
ALT: 12 U/L (ref 0–53)
AST: 13 U/L (ref 0–37)
Albumin: 3.6 g/dL (ref 3.5–5.2)
Alkaline Phosphatase: 61 U/L (ref 39–117)
BUN: 12 mg/dL (ref 6–23)
CO2: 30 mEq/L (ref 19–32)
Calcium: 9.2 mg/dL (ref 8.4–10.5)
Chloride: 102 mEq/L (ref 96–112)
Creatinine, Ser: 0.81 mg/dL (ref 0.40–1.50)
GFR: 100.02 mL/min (ref >60.00)
Glucose, Bld: 108 mg/dL — ABNORMAL HIGH (ref 70–99)
Potassium: 4.3 mEq/L (ref 3.5–5.1)
Sodium: 138 mEq/L (ref 135–145)
Total Bilirubin: 1.2 mg/dL (ref 0.2–1.2)
Total Protein: 6.9 g/dL (ref 6.0–8.3)

## 2022-03-01 LAB — PSA: PSA: 7.24 ng/mL — ABNORMAL HIGH (ref 0.10–4.00)

## 2022-03-01 LAB — LIPID PANEL
Cholesterol: 153 mg/dL (ref 0–200)
HDL: 40.2 mg/dL (ref >39.00)
LDL Cholesterol: 101 mg/dL — ABNORMAL HIGH (ref 0–99)
NonHDL: 113.05
Total CHOL/HDL Ratio: 4
Triglycerides: 58 mg/dL (ref 0.0–149.0)
VLDL: 11.6 mg/dL (ref 0.0–40.0)

## 2022-03-01 LAB — HEMOGLOBIN A1C: Hgb A1c MFr Bld: 6 % (ref 4.6–6.5)

## 2022-03-01 MED ORDER — ESOMEPRAZOLE MAGNESIUM 40 MG PO CPDR: 40.0000 mg | DELAYED_RELEASE_CAPSULE | Freq: Every day | ORAL | 3 refills | Status: AC

## 2022-03-01 MED ORDER — FLUTICASONE PROPIONATE 50 MCG/ACT NA SUSP: 2.0000 | Freq: Every day | NASAL | 2 refills | Status: DC

## 2022-03-01 NOTE — Progress Notes (Signed)
Brett Guerrero 56 y.o.   Chief Complaint  Patient presents with   Annual Exam    No concerns     HISTORY OF PRESENT ILLNESS: This is a 56 y.o. male here for annual exam. Has history of asthma and environmental allergies. History of GERD.  Nexium works. Up-to-date with colonoscopy.  Last colonoscopy November 2022 showed diverticulosis and internal hemorrhoids.  Otherwise normal. 1 older brother was recently diagnosed with prostate cancer.  No one else in the family with prostate cancer. Doing well.  Has no complaints or medical concerns  HPI   Prior to Admission medications   Medication Sig Start Date End Date Taking? Authorizing Provider  esomeprazole (NEXIUM) 40 MG capsule Take 1 capsule (40 mg total) by mouth daily. Take 30 to 60 minutes before a meal 10/26/21  Yes Arvo Ealy, Calvin, MD  fluticasone [facility]) 50 MCG/ACT nasal spray Place 2 sprays into both nostrils daily. 11/16/21  Yes Ayianna Darnold, Eilleen Kempf, MD  fluticasone furoate-vilanterol (BREO ELLIPTA) 200-25 MCG/INH AEPB Inhale 1 puff into the lungs daily. 02/15/21  Yes Salena Saner, MD  sildenafil (VIAGRA) 100 MG tablet Take 0.5-1 tablets (50-100 mg total) by mouth daily as needed for erectile dysfunction. 04/02/21  Yes Phylisha Dix, Eilleen Kempf, MD  albuterol (VENTOLIN HFA) 108 (90 Base) MCG/ACT inhaler Inhale into the lungs. Patient not taking: Reported on 08/30/2021 06/07/20   [provider]  promethazine (PHENERGAN) 25 MG tablet Take 1 tablet (25 mg total) by mouth every 8 (eight) hours as needed for nausea (headache). Patient not taking: Reported on 10/11/2018 05/07/17 10/22/20  Sharyn Creamer, MD    No Known Allergies  There are no problems to display for this patient.   Past Medical History:  Diagnosis Date   Asthma    Family history of colon cancer    brother - 64s   GERD (gastroesophageal reflux disease)    Sciatica of left side     History reviewed. No pertinent surgical history.  Social History    Socioeconomic History   Marital status: Married    Spouse name: Not on file   Number of children: Not on file   Years of education: Not on file   Highest education level: Not on file  Occupational History   Not on file  Tobacco Use   Smoking status: Never   Smokeless tobacco: Never  Vaping Use   Vaping Use: Never used  Substance and Sexual Activity   Alcohol use: No   Drug use: No   Sexual activity: Not on file  Other Topics Concern   Not on file  Social History Narrative   Not on file   Social Determinants of Health   Financial Resource Strain: Not on file  Food Insecurity: Not on file  Transportation Needs: Not on file  Physical Activity: Not on file  Stress: Not on file  Social Connections: Not on file  Intimate Partner Violence: Not on file    Family History  Problem Relation Age of Onset   Cancer Mother    Diabetes Mother    Hyperlipidemia Father    Colon cancer Brother 29   Esophageal cancer Neg Hx    Stomach cancer Neg Hx    Rectal cancer Neg Hx      Review of Systems  Constitutional: Negative.  Negative for fever.  HENT: Negative.  Negative for congestion and sore throat.   Eyes: Negative.   Respiratory: Negative.  Negative for cough and shortness of breath.   Cardiovascular:  Negative.  Negative for chest pain and palpitations.  Gastrointestinal: Negative.  Negative for abdominal pain, diarrhea, nausea and vomiting.  Genitourinary: Negative.  Negative for dysuria and hematuria.  Skin: Negative.  Negative for rash.  Neurological:  Negative for dizziness and headaches.  Endo/Heme/Allergies:  Positive for environmental allergies.  All other systems reviewed and are negative.  Today's Vitals   03/01/22 0805  BP: 116/78  Pulse: 88  Temp: 97.9 F (36.6 C)  TempSrc: Oral  SpO2: 97%  Weight: 251 lb 4 oz (114 kg)  Height: 5\' 8"  (1.727 m)   Body mass index is 38.2 kg/m.  Physical Exam Vitals reviewed.  Constitutional:      Appearance:  Normal appearance.  HENT:     Head: Normocephalic.     Right Ear: Tympanic membrane, ear canal and external ear normal.     Left Ear: Tympanic membrane, ear canal and external ear normal.     Mouth/Throat:     Mouth: Mucous membranes are moist.     Pharynx: Oropharynx is clear.  Eyes:     Extraocular Movements: Extraocular movements intact.     Conjunctiva/sclera: Conjunctivae normal.     Pupils: Pupils are equal, round, and reactive to light.  Cardiovascular:     Rate and Rhythm: Normal rate and regular rhythm.     Pulses: Normal pulses.     Heart sounds: Normal heart sounds.  Pulmonary:     Effort: Pulmonary effort is normal.     Breath sounds: Normal breath sounds.  Abdominal:     Palpations: Abdomen is soft.     Tenderness: There is no abdominal tenderness.  Musculoskeletal:     Cervical back: No tenderness.     Right lower leg: No edema.     Left lower leg: No edema.  Lymphadenopathy:     Cervical: No cervical adenopathy.  Skin:    General: Skin is warm and dry.     Capillary Refill: Capillary refill takes less than 2 seconds.  Neurological:     General: No focal deficit present.     Mental Status: He is alert and oriented to person, place, and time.  Psychiatric:        Mood and Affect: Mood normal.        Behavior: Behavior normal.     ASSESSMENT & PLAN: Problem List Items Addressed This Visit   None Visit Diagnoses     Routine general medical examination at a health care facility    -  Primary   History of asthma       History of environmental allergies       Prostate cancer screening       Relevant Orders   PSA(Must document that pt has been informed of limitations of PSA testing.)   Screening for deficiency anemia       Relevant Orders   CBC with Differential   Screening for lipoid disorders       Relevant Orders   Lipid panel   Screening for endocrine, metabolic and immunity disorder       Relevant Orders   Comprehensive metabolic panel    Hemoglobin A1c      Modifiable risk factors discussed with patient. Anticipatory guidance according to age provided. The following topics were also discussed: Social Determinants of Health Smoking.  Non-smoker Diet and nutrition and need to decrease amount of daily carbohydrate intake Benefits of exercise Cancer screening and review of most recent colonoscopy report Vaccinations recommendations Cardiovascular risk assessment  and need for blood work The 10-year ASCVD risk score (Arnett DK, et al., 2019) is: 5.4%   Values used to calculate the score:     Age: 34 years     Sex: Male     Is Non-Hispanic African American: Yes     Diabetic: No     Tobacco smoker: No     Systolic Blood Pressure: 116 mmHg     Is BP treated: No     HDL Cholesterol: 38 mg/dL     Total Cholesterol: 151 mg/dL  Mental health including depression and anxiety Fall and accident prevention  Patient Instructions  Health Maintenance, Male Adopting a healthy lifestyle and getting preventive care are important in promoting health and wellness. Ask your health care provider about: The right schedule for you to have regular tests and exams. Things you can do on your own to prevent diseases and keep yourself healthy. What should I know about diet, weight, and exercise? Eat a healthy diet  Eat a diet that includes plenty of vegetables, fruits, low-fat dairy products, and lean protein. Do not eat a lot of foods that are high in solid fats, added sugars, or sodium. Maintain a healthy weight Body mass index (BMI) is a measurement that can be used to identify possible weight problems. It estimates body fat based on height and weight. Your health care provider can help determine your BMI and help you achieve or maintain a healthy weight. Get regular exercise Get regular exercise. This is one of the most important things you can do for your health. Most adults should: Exercise for at least 150 minutes each week. The  exercise should increase your heart rate and make you sweat (moderate-intensity exercise). Do strengthening exercises at least twice a week. This is in addition to the moderate-intensity exercise. Spend less time sitting. Even light physical activity can be beneficial. Watch cholesterol and blood lipids Have your blood tested for lipids and cholesterol at 55 years of age, then have this test every 5 years. You may need to have your cholesterol levels checked more often if: Your lipid or cholesterol levels are high. You are older than 55 years of age. You are at high risk for heart disease. What should I know about cancer screening? Many types of cancers can be detected early and may often be prevented. Depending on your health history and family history, you may need to have cancer screening at various ages. This may include screening for: Colorectal cancer. Prostate cancer. Skin cancer. Lung cancer. What should I know about heart disease, diabetes, and high blood pressure? Blood pressure and heart disease High blood pressure causes heart disease and increases the risk of stroke. This is more likely to develop in people who have high blood pressure readings or are overweight. Talk with your health care provider about your target blood pressure readings. Have your blood pressure checked: Every 3-5 years if you are 65-50 years of age. Every year if you are 46 years old or older. If you are between the ages of 59 and 36 and are a current or former smoker, ask your health care provider if you should have a one-time screening for abdominal aortic aneurysm (AAA). Diabetes Have regular diabetes screenings. This checks your fasting blood sugar level. Have the screening done: Once every three years after age 31 if you are at a normal weight and have a low risk for diabetes. More often and at a younger age if you are overweight or have  a high risk for diabetes. What should I know about preventing  infection? Hepatitis B If you have a higher risk for hepatitis B, you should be screened for this virus. Talk with your health care provider to find out if you are at risk for hepatitis B infection. Hepatitis C Blood testing is recommended for: Everyone born from [birth year] through 1964. Anyone with known risk factors for hepatitis C. Sexually transmitted infections (STIs) You should be screened each year for STIs, including gonorrhea and chlamydia, if: You are sexually active and are younger than 55 years of age. You are older than 55 years of age and your health care provider tells you that you are at risk for this type of infection. Your sexual activity has changed since you were last screened, and you are at increased risk for chlamydia or gonorrhea. Ask your health care provider if you are at risk. Ask your health care provider about whether you are at high risk for HIV. Your health care provider may recommend a prescription medicine to help prevent HIV infection. If you choose to take medicine to prevent HIV, you should first get tested for HIV. You should then be tested every 3 months for as long as you are taking the medicine. Follow these instructions at home: Alcohol use Do not drink alcohol if your health care provider tells you not to drink. If you drink alcohol: Limit how much you have to 0-2 drinks a day. Know how much alcohol is in your drink. In the [place] one drink equals one 12 oz bottle of beer (355 mL), one 5 oz glass of wine (148 mL), or one 1 oz glass of hard liquor (44 mL). Lifestyle Do not use any products that contain nicotine or tobacco. These products include cigarettes, chewing tobacco, and vaping devices, such as e-cigarettes. If you need help quitting, ask your health care provider. Do not use street drugs. Do not share needles. Ask your health care provider for help if you need support or information about quitting drugs. General instructions Schedule regular health,  dental, and eye exams. Stay current with your vaccines. Tell your health care provider if: You often feel depressed. You have ever been abused or do not feel safe at home. Summary Adopting a healthy lifestyle and getting preventive care are important in promoting health and wellness. Follow your health care provider's instructions about healthy diet, exercising, and getting tested or screened for diseases. Follow your health care provider's instructions on monitoring your cholesterol and blood pressure. This information is not intended to replace advice given to you by your health care provider. Make sure you discuss any questions you have with your health care provider. Document Revised: 02/08/2021 Document Reviewed: 02/08/2021 Elsevier Patient Education  2023 Elsevier Inc.     Edwina Barth, MD [facility]

## 2022-03-01 NOTE — Patient Instructions (Signed)
Health Maintenance, Male Adopting a healthy lifestyle and getting preventive care are important in promoting health and wellness. Ask your health care provider about: The right schedule for you to have regular tests and exams. Things you can do on your own to prevent diseases and keep yourself healthy. What should I know about diet, weight, and exercise? Eat a healthy diet  Eat a diet that includes plenty of vegetables, fruits, low-fat dairy products, and lean protein. Do not eat a lot of foods that are high in solid fats, added sugars, or sodium. Maintain a healthy weight Body mass index (BMI) is a measurement that can be used to identify possible weight problems. It estimates body fat based on height and weight. Your health care provider can help determine your BMI and help you achieve or maintain a healthy weight. Get regular exercise Get regular exercise. This is one of the most important things you can do for your health. Most adults should: Exercise for at least 150 minutes each week. The exercise should increase your heart rate and make you sweat (moderate-intensity exercise). Do strengthening exercises at least twice a week. This is in addition to the moderate-intensity exercise. Spend less time sitting. Even light physical activity can be beneficial. Watch cholesterol and blood lipids Have your blood tested for lipids and cholesterol at 55 years of age, then have this test every 5 years. You may need to have your cholesterol levels checked more often if: Your lipid or cholesterol levels are high. You are older than 55 years of age. You are at high risk for heart disease. What should I know about cancer screening? Many types of cancers can be detected early and may often be prevented. Depending on your health history and family history, you may need to have cancer screening at various ages. This may include screening for: Colorectal cancer. Prostate cancer. Skin cancer. Lung  cancer. What should I know about heart disease, diabetes, and high blood pressure? Blood pressure and heart disease High blood pressure causes heart disease and increases the risk of stroke. This is more likely to develop in people who have high blood pressure readings or are overweight. Talk with your health care provider about your target blood pressure readings. Have your blood pressure checked: Every 3-5 years if you are 18-39 years of age. Every year if you are 40 years old or older. If you are between the ages of 65 and 75 and are a current or former smoker, ask your health care provider if you should have a one-time screening for abdominal aortic aneurysm (AAA). Diabetes Have regular diabetes screenings. This checks your fasting blood sugar level. Have the screening done: Once every three years after age 45 if you are at a normal weight and have a low risk for diabetes. More often and at a younger age if you are overweight or have a high risk for diabetes. What should I know about preventing infection? Hepatitis B If you have a higher risk for hepatitis B, you should be screened for this virus. Talk with your health care provider to find out if you are at risk for hepatitis B infection. Hepatitis C Blood testing is recommended for: Everyone born from 1945 through 1965. Anyone with known risk factors for hepatitis C. Sexually transmitted infections (STIs) You should be screened each year for STIs, including gonorrhea and chlamydia, if: You are sexually active and are younger than 55 years of age. You are older than 55 years of age and your   health care provider tells you that you are at risk for this type of infection. Your sexual activity has changed since you were last screened, and you are at increased risk for chlamydia or gonorrhea. Ask your health care provider if you are at risk. Ask your health care provider about whether you are at high risk for HIV. Your health care provider  may recommend a prescription medicine to help prevent HIV infection. If you choose to take medicine to prevent HIV, you should first get tested for HIV. You should then be tested every 3 months for as long as you are taking the medicine. Follow these instructions at home: Alcohol use Do not drink alcohol if your health care provider tells you not to drink. If you drink alcohol: Limit how much you have to 0-2 drinks a day. Know how much alcohol is in your drink. In the [place] one drink equals one 12 oz bottle of beer (355 mL), one 5 oz glass of wine (148 mL), or one 1 oz glass of hard liquor (44 mL). Lifestyle Do not use any products that contain nicotine or tobacco. These products include cigarettes, chewing tobacco, and vaping devices, such as e-cigarettes. If you need help quitting, ask your health care provider. Do not use street drugs. Do not share needles. Ask your health care provider for help if you need support or information about quitting drugs. General instructions Schedule regular health, dental, and eye exams. Stay current with your vaccines. Tell your health care provider if: You often feel depressed. You have ever been abused or do not feel safe at home. Summary Adopting a healthy lifestyle and getting preventive care are important in promoting health and wellness. Follow your health care provider's instructions about healthy diet, exercising, and getting tested or screened for diseases. Follow your health care provider's instructions on monitoring your cholesterol and blood pressure. This information is not intended to replace advice given to you by your health care provider. Make sure you discuss any questions you have with your health care provider. Document Revised: 02/08/2021 Document Reviewed: 02/08/2021 Elsevier Patient Education  2023 Elsevier Inc.  

## 2022-03-02 ENCOUNTER — Telehealth: Payer: Self-pay | Admitting: Emergency Medicine

## 2022-03-02 NOTE — Telephone Encounter (Signed)
Patient is returning call to [facility] - she will call him back

## 2022-03-02 NOTE — Telephone Encounter (Signed)
Called patient and left message in reference to his request for his lab results.

## 2022-03-02 NOTE — Telephone Encounter (Signed)
Patient would like to be called about his test results for lipids

## 2022-03-03 NOTE — Telephone Encounter (Signed)
Patient is returning your call - please call him back

## 2022-03-03 NOTE — Telephone Encounter (Signed)
Called patient back and left message pertaining to lab results. If patient calls back please inform patient that his lab results are on Mychart with provider recommendations.

## 2022-03-04 ENCOUNTER — Encounter: Payer: Self-pay | Admitting: Emergency Medicine

## 2022-03-04 DIAGNOSIS — R972 Elevated prostate specific antigen [PSA]: Secondary | ICD-10-CM

## 2022-03-04 DIAGNOSIS — Z8042 Family history of malignant neoplasm of prostate: Secondary | ICD-10-CM

## 2022-03-07 ENCOUNTER — Ambulatory Visit: Payer: 59 | Admitting: Emergency Medicine

## 2022-03-07 ENCOUNTER — Encounter: Payer: Self-pay | Admitting: Emergency Medicine

## 2022-03-07 VITALS — BP 116/78 | HR 104 | Temp 98.3°F | Ht 68.0 in | Wt 253.1 lb

## 2022-03-07 DIAGNOSIS — N529 Male erectile dysfunction, unspecified: Secondary | ICD-10-CM | POA: Insufficient documentation

## 2022-03-07 DIAGNOSIS — Z8042 Family history of malignant neoplasm of prostate: Secondary | ICD-10-CM | POA: Diagnosis not present

## 2022-03-07 DIAGNOSIS — R972 Elevated prostate specific antigen [PSA]: Secondary | ICD-10-CM | POA: Diagnosis not present

## 2022-03-07 MED ORDER — SILDENAFIL CITRATE 100 MG PO TABS: 50.0000 mg | ORAL_TABLET | Freq: Every day | ORAL | 11 refills | Status: DC | PRN

## 2022-03-07 NOTE — Assessment & Plan Note (Signed)
Differential diagnosis discussed with patient including possibility of prostate cancer.  Patient has a family history of such. Scheduled to see urologist tomorrow.

## 2022-03-07 NOTE — Progress Notes (Signed)
Brett Guerrero 56 y.o.   Chief Complaint  Patient presents with   Follow-up    F/u on blood work     HISTORY OF PRESENT ILLNESS: This is a 56 y.o. male seen by me last week for annual exam and found to have elevated PSA. Patient has family history of prostate cancer.  Scheduled for urology evaluation tomorrow morning. Has questions and concerns about blood work results. Also requesting prescription for Viagra 100 mg. No other complaints or medical concerns today.  HPI   Prior to Admission medications   Medication Sig Start Date End Date Taking? Authorizing Provider  esomeprazole (NEXIUM) 40 MG capsule Take 1 capsule (40 mg total) by mouth daily. Take 30 to 60 minutes before a meal 03/01/22  Yes Danasia Baker, [place], MD  fluticasone [facility]) 50 MCG/ACT nasal spray Place 2 sprays into both nostrils daily. 03/01/22  Yes Janasha Barkalow, Eilleen Kempf, MD  fluticasone furoate-vilanterol (BREO ELLIPTA) 200-25 MCG/INH AEPB Inhale 1 puff into the lungs daily. 02/15/21  Yes Salena Saner, MD  albuterol (VENTOLIN HFA) 108 (90 Base) MCG/ACT inhaler Inhale into the lungs. Patient not taking: Reported on 03/07/2022 06/07/20   [provider]  sildenafil (VIAGRA) 100 MG tablet Take 0.5-1 tablets (50-100 mg total) by mouth daily as needed for erectile dysfunction. 03/07/22   Georgina Quint, MD  promethazine (PHENERGAN) 25 MG tablet Take 1 tablet (25 mg total) by mouth every 8 (eight) hours as needed for nausea (headache). Patient not taking: Reported on 10/11/2018 05/07/17 10/22/20  Sharyn Creamer, MD    No Known Allergies  Patient Active Problem List   Diagnosis Date Noted   Chronic bronchitis with wheezing (HCC) 05/01/2020    Past Medical History:  Diagnosis Date   Asthma    Family history of colon cancer    brother - 30s   GERD (gastroesophageal reflux disease)    Sciatica of left side     No past surgical history on file.  Social History   Socioeconomic History   Marital status:  Married    Spouse name: Not on file   Number of children: Not on file   Years of education: Not on file   Highest education level: Not on file  Occupational History   Not on file  Tobacco Use   Smoking status: Never   Smokeless tobacco: Never  Vaping Use   Vaping Use: Never used  Substance and Sexual Activity   Alcohol use: No   Drug use: No   Sexual activity: Not on file  Other Topics Concern   Not on file  Social History Narrative   Not on file   Social Determinants of Health   Financial Resource Strain: Not on file  Food Insecurity: Not on file  Transportation Needs: Not on file  Physical Activity: Not on file  Stress: Not on file  Social Connections: Not on file  Intimate Partner Violence: Not on file    Family History  Problem Relation Age of Onset   Cancer Mother    Diabetes Mother    Hyperlipidemia Father    Colon cancer Brother 55   Esophageal cancer Neg Hx    Stomach cancer Neg Hx    Rectal cancer Neg Hx      Review of Systems  Constitutional: Negative.  Negative for chills and fever.  HENT: Negative.  Negative for congestion and sore throat.   Respiratory: Negative.  Negative for cough and shortness of breath.   Cardiovascular: Negative.  Negative  for chest pain and palpitations.  Gastrointestinal: Negative.  Negative for abdominal pain, diarrhea, nausea and vomiting.  Genitourinary: Negative.  Negative for dysuria and hematuria.  Musculoskeletal: Negative.   Skin: Negative.  Negative for rash.  Neurological: Negative.  Negative for dizziness and headaches.  All other systems reviewed and are negative. Today's Vitals   03/07/22 0928  BP: 116/78  Pulse: (!) 104  Temp: 98.3 F (36.8 C)  TempSrc: Oral  SpO2: 97%  Weight: 253 lb 2 oz (114.8 kg)  Height: 5\' 8"  (1.727 m)   Body mass index is 38.49 kg/m.   Physical Exam Vitals reviewed.  Constitutional:      Appearance: Normal appearance.  HENT:     Head: Normocephalic.  Eyes:      Extraocular Movements: Extraocular movements intact.  Cardiovascular:     Rate and Rhythm: Normal rate.  Pulmonary:     Effort: Pulmonary effort is normal.  Musculoskeletal:        General: Normal range of motion.  Skin:    General: Skin is warm and dry.     Capillary Refill: Capillary refill takes less than 2 seconds.  Neurological:     General: No focal deficit present.     Mental Status: He is alert and oriented to person, place, and time.  Psychiatric:        Mood and Affect: Mood normal.        Behavior: Behavior normal.     ASSESSMENT & PLAN: A total of 30 minutes was spent with the patient and counseling/coordination of care regarding preparing for this visit, review of last office visit note, review with most recent blood work results with patient, differential diagnosis of elevated PSA and need for urology evaluation, prognosis, documentation, and need for follow-up.  Problem List Items Addressed This Visit       Other   Elevated PSA - Primary    Differential diagnosis discussed with patient including possibility of prostate cancer.  Patient has a family history of such. Scheduled to see urologist tomorrow.       Family history of prostate cancer   Erectile dysfunction   Patient Instructions  Health Maintenance, Male Adopting a healthy lifestyle and getting preventive care are important in promoting health and wellness. Ask your health care provider about: The right schedule for you to have regular tests and exams. Things you can do on your own to prevent diseases and keep yourself healthy. What should I know about diet, weight, and exercise? Eat a healthy diet  Eat a diet that includes plenty of vegetables, fruits, low-fat dairy products, and lean protein. Do not eat a lot of foods that are high in solid fats, added sugars, or sodium. Maintain a healthy weight Body mass index (BMI) is a measurement that can be used to identify possible weight problems. It  estimates body fat based on height and weight. Your health care provider can help determine your BMI and help you achieve or maintain a healthy weight. Get regular exercise Get regular exercise. This is one of the most important things you can do for your health. Most adults should: Exercise for at least 150 minutes each week. The exercise should increase your heart rate and make you sweat (moderate-intensity exercise). Do strengthening exercises at least twice a week. This is in addition to the moderate-intensity exercise. Spend less time sitting. Even light physical activity can be beneficial. Watch cholesterol and blood lipids Have your blood tested for lipids and cholesterol at 55  years of age, then have this test every 5 years. You may need to have your cholesterol levels checked more often if: Your lipid or cholesterol levels are high. You are older than 55 years of age. You are at high risk for heart disease. What should I know about cancer screening? Many types of cancers can be detected early and may often be prevented. Depending on your health history and family history, you may need to have cancer screening at various ages. This may include screening for: Colorectal cancer. Prostate cancer. Skin cancer. Lung cancer. What should I know about heart disease, diabetes, and high blood pressure? Blood pressure and heart disease High blood pressure causes heart disease and increases the risk of stroke. This is more likely to develop in people who have high blood pressure readings or are overweight. Talk with your health care provider about your target blood pressure readings. Have your blood pressure checked: Every 3-5 years if you are 28-53 years of age. Every year if you are 10 years old or older. If you are between the ages of 83 and 58 and are a current or former smoker, ask your health care provider if you should have a one-time screening for abdominal aortic aneurysm  (AAA). Diabetes Have regular diabetes screenings. This checks your fasting blood sugar level. Have the screening done: Once every three years after age 91 if you are at a normal weight and have a low risk for diabetes. More often and at a younger age if you are overweight or have a high risk for diabetes. What should I know about preventing infection? Hepatitis B If you have a higher risk for hepatitis B, you should be screened for this virus. Talk with your health care provider to find out if you are at risk for hepatitis B infection. Hepatitis C Blood testing is recommended for: Everyone born from [birth year] through 1964. Anyone with known risk factors for hepatitis C. Sexually transmitted infections (STIs) You should be screened each year for STIs, including gonorrhea and chlamydia, if: You are sexually active and are younger than 55 years of age. You are older than 55 years of age and your health care provider tells you that you are at risk for this type of infection. Your sexual activity has changed since you were last screened, and you are at increased risk for chlamydia or gonorrhea. Ask your health care provider if you are at risk. Ask your health care provider about whether you are at high risk for HIV. Your health care provider may recommend a prescription medicine to help prevent HIV infection. If you choose to take medicine to prevent HIV, you should first get tested for HIV. You should then be tested every 3 months for as long as you are taking the medicine. Follow these instructions at home: Alcohol use Do not drink alcohol if your health care provider tells you not to drink. If you drink alcohol: Limit how much you have to 0-2 drinks a day. Know how much alcohol is in your drink. In the [place] one drink equals one 12 oz bottle of beer (355 mL), one 5 oz glass of wine (148 mL), or one 1 oz glass of hard liquor (44 mL). Lifestyle Do not use any products that contain nicotine or  tobacco. These products include cigarettes, chewing tobacco, and vaping devices, such as e-cigarettes. If you need help quitting, ask your health care provider. Do not use street drugs. Do not share needles. Ask your health care  provider for help if you need support or information about quitting drugs. General instructions Schedule regular health, dental, and eye exams. Stay current with your vaccines. Tell your health care provider if: You often feel depressed. You have ever been abused or do not feel safe at home. Summary Adopting a healthy lifestyle and getting preventive care are important in promoting health and wellness. Follow your health care provider's instructions about healthy diet, exercising, and getting tested or screened for diseases. Follow your health care provider's instructions on monitoring your cholesterol and blood pressure. This information is not intended to replace advice given to you by your health care provider. Make sure you discuss any questions you have with your health care provider. Document Revised: 02/08/2021 Document Reviewed: 02/08/2021 Elsevier Patient Education  2023 Elsevier Inc.    Edwina Barth, MD [facility]

## 2022-03-07 NOTE — Patient Instructions (Signed)
Health Maintenance, Male Adopting a healthy lifestyle and getting preventive care are important in promoting health and wellness. Ask your health care provider about: The right schedule for you to have regular tests and exams. Things you can do on your own to prevent diseases and keep yourself healthy. What should I know about diet, weight, and exercise? Eat a healthy diet  Eat a diet that includes plenty of vegetables, fruits, low-fat dairy products, and lean protein. Do not eat a lot of foods that are high in solid fats, added sugars, or sodium. Maintain a healthy weight Body mass index (BMI) is a measurement that can be used to identify possible weight problems. It estimates body fat based on height and weight. Your health care provider can help determine your BMI and help you achieve or maintain a healthy weight. Get regular exercise Get regular exercise. This is one of the most important things you can do for your health. Most adults should: Exercise for at least 150 minutes each week. The exercise should increase your heart rate and make you sweat (moderate-intensity exercise). Do strengthening exercises at least twice a week. This is in addition to the moderate-intensity exercise. Spend less time sitting. Even light physical activity can be beneficial. Watch cholesterol and blood lipids Have your blood tested for lipids and cholesterol at 55 years of age, then have this test every 5 years. You may need to have your cholesterol levels checked more often if: Your lipid or cholesterol levels are high. You are older than 55 years of age. You are at high risk for heart disease. What should I know about cancer screening? Many types of cancers can be detected early and may often be prevented. Depending on your health history and family history, you may need to have cancer screening at various ages. This may include screening for: Colorectal cancer. Prostate cancer. Skin cancer. Lung  cancer. What should I know about heart disease, diabetes, and high blood pressure? Blood pressure and heart disease High blood pressure causes heart disease and increases the risk of stroke. This is more likely to develop in people who have high blood pressure readings or are overweight. Talk with your health care provider about your target blood pressure readings. Have your blood pressure checked: Every 3-5 years if you are 18-39 years of age. Every year if you are 40 years old or older. If you are between the ages of 65 and 75 and are a current or former smoker, ask your health care provider if you should have a one-time screening for abdominal aortic aneurysm (AAA). Diabetes Have regular diabetes screenings. This checks your fasting blood sugar level. Have the screening done: Once every three years after age 45 if you are at a normal weight and have a low risk for diabetes. More often and at a younger age if you are overweight or have a high risk for diabetes. What should I know about preventing infection? Hepatitis B If you have a higher risk for hepatitis B, you should be screened for this virus. Talk with your health care provider to find out if you are at risk for hepatitis B infection. Hepatitis C Blood testing is recommended for: Everyone born from 1945 through 1965. Anyone with known risk factors for hepatitis C. Sexually transmitted infections (STIs) You should be screened each year for STIs, including gonorrhea and chlamydia, if: You are sexually active and are younger than 55 years of age. You are older than 55 years of age and your   health care provider tells you that you are at risk for this type of infection. Your sexual activity has changed since you were last screened, and you are at increased risk for chlamydia or gonorrhea. Ask your health care provider if you are at risk. Ask your health care provider about whether you are at high risk for HIV. Your health care provider  may recommend a prescription medicine to help prevent HIV infection. If you choose to take medicine to prevent HIV, you should first get tested for HIV. You should then be tested every 3 months for as long as you are taking the medicine. Follow these instructions at home: Alcohol use Do not drink alcohol if your health care provider tells you not to drink. If you drink alcohol: Limit how much you have to 0-2 drinks a day. Know how much alcohol is in your drink. In the [place] one drink equals one 12 oz bottle of beer (355 mL), one 5 oz glass of wine (148 mL), or one 1 oz glass of hard liquor (44 mL). Lifestyle Do not use any products that contain nicotine or tobacco. These products include cigarettes, chewing tobacco, and vaping devices, such as e-cigarettes. If you need help quitting, ask your health care provider. Do not use street drugs. Do not share needles. Ask your health care provider for help if you need support or information about quitting drugs. General instructions Schedule regular health, dental, and eye exams. Stay current with your vaccines. Tell your health care provider if: You often feel depressed. You have ever been abused or do not feel safe at home. Summary Adopting a healthy lifestyle and getting preventive care are important in promoting health and wellness. Follow your health care provider's instructions about healthy diet, exercising, and getting tested or screened for diseases. Follow your health care provider's instructions on monitoring your cholesterol and blood pressure. This information is not intended to replace advice given to you by your health care provider. Make sure you discuss any questions you have with your health care provider. Document Revised: 02/08/2021 Document Reviewed: 02/08/2021 Elsevier Patient Education  2023 Elsevier Inc.  

## 2022-03-08 ENCOUNTER — Telehealth: Payer: Self-pay | Admitting: Emergency Medicine

## 2022-03-08 DIAGNOSIS — N401 Enlarged prostate with lower urinary tract symptoms: Secondary | ICD-10-CM | POA: Diagnosis not present

## 2022-03-08 DIAGNOSIS — R972 Elevated prostate specific antigen [PSA]: Secondary | ICD-10-CM | POA: Diagnosis not present

## 2022-03-08 NOTE — Telephone Encounter (Signed)
Tests results need to be sent to Dr. Otelia Limes. [place], [place], [place]  Fax:  [phone]

## 2022-03-08 NOTE — Telephone Encounter (Signed)
Test results sent to provider per patient request

## 2022-03-17 ENCOUNTER — Other Ambulatory Visit: Payer: Self-pay | Admitting: Urology

## 2022-03-17 DIAGNOSIS — R972 Elevated prostate specific antigen [PSA]: Secondary | ICD-10-CM

## 2022-03-21 ENCOUNTER — Ambulatory Visit: Admission: RE | Admit: 2022-03-21 | Payer: 59 | Source: Ambulatory Visit

## 2022-03-29 ENCOUNTER — Other Ambulatory Visit: Payer: Self-pay | Admitting: Orthopedic Surgery

## 2022-03-29 ENCOUNTER — Other Ambulatory Visit: Payer: Self-pay | Admitting: Urology

## 2022-03-29 DIAGNOSIS — R972 Elevated prostate specific antigen [PSA]: Secondary | ICD-10-CM

## 2022-04-16 ENCOUNTER — Ambulatory Visit
Admission: RE | Admit: 2022-04-16 | Discharge: 2022-04-16 | Disposition: A | Discharge status: Home / Self Care | Payer: 59 | Source: Ambulatory Visit | Attending: Urology | Admitting: Urology

## 2022-04-16 DIAGNOSIS — R972 Elevated prostate specific antigen [PSA]: Secondary | ICD-10-CM

## 2022-06-04 ENCOUNTER — Ambulatory Visit
Admission: RE | Admit: 2022-06-04 | Discharge: 2022-06-04 | Disposition: A | Discharge status: Home / Self Care | Payer: 59 | Source: Ambulatory Visit | Attending: Urology | Admitting: Urology

## 2022-06-04 DIAGNOSIS — R972 Elevated prostate specific antigen [PSA]: Secondary | ICD-10-CM | POA: Diagnosis not present

## 2022-06-04 MED ORDER — GADOPICLENOL 0.5 MMOL/ML IV SOLN
10.0000 mL | Freq: Once | INTRAVENOUS | Status: AC | PRN
  Administered 2022-06-04: 10 mL via INTRAVENOUS

## 2022-06-24 ENCOUNTER — Ambulatory Visit (INDEPENDENT_AMBULATORY_CARE_PROVIDER_SITE_OTHER): Payer: 59 | Admitting: Nurse Practitioner

## 2022-06-24 ENCOUNTER — Encounter: Payer: Self-pay | Admitting: Nurse Practitioner

## 2022-06-24 ENCOUNTER — Telehealth: Payer: Self-pay | Admitting: Nurse Practitioner

## 2022-06-24 VITALS — BP 120/80 | HR 95 | Ht 68.0 in | Wt 266.2 lb

## 2022-06-24 DIAGNOSIS — N5201 Erectile dysfunction due to arterial insufficiency: Secondary | ICD-10-CM | POA: Diagnosis not present

## 2022-06-24 DIAGNOSIS — J301 Allergic rhinitis due to pollen: Secondary | ICD-10-CM | POA: Diagnosis not present

## 2022-06-24 DIAGNOSIS — J4530 Mild persistent asthma, uncomplicated: Secondary | ICD-10-CM | POA: Insufficient documentation

## 2022-06-24 DIAGNOSIS — R972 Elevated prostate specific antigen [PSA]: Secondary | ICD-10-CM | POA: Diagnosis not present

## 2022-06-24 DIAGNOSIS — N401 Enlarged prostate with lower urinary tract symptoms: Secondary | ICD-10-CM | POA: Diagnosis not present

## 2022-06-24 DIAGNOSIS — J309 Allergic rhinitis, unspecified: Secondary | ICD-10-CM | POA: Insufficient documentation

## 2022-06-24 MED ORDER — LEVOCETIRIZINE DIHYDROCHLORIDE 5 MG PO TABS: 5.0000 mg | ORAL_TABLET | Freq: Every evening | ORAL | 5 refills | Status: DC

## 2022-06-24 MED ORDER — FLUTICASONE FUROATE-VILANTEROL 200-25 MCG/ACT IN AEPB: 1.0000 | INHALATION_SPRAY | Freq: Every day | RESPIRATORY_TRACT | 5 refills | Status: DC

## 2022-06-24 NOTE — Assessment & Plan Note (Signed)
Significant positives on RAST. Recommended he start antihistamine. Rx for Xyzal sent today. Continue intranasal steroid.

## 2022-06-24 NOTE — Progress Notes (Signed)
Agree with the details of the visit as noted by Marland Kitchen, NP.  Renold Don, MD [facility]

## 2022-06-24 NOTE — Patient Instructions (Addendum)
Restart Breo 1 puff daily. Brush tongue and rinse mouth afterwards Continue Albuterol inhaler 2 puffs every 6 hours as needed for shortness of breath or wheezing. Notify if symptoms persist despite rescue inhaler/neb use.  Continue flonase nasal spray 2 sprays each nostril daily   Start Xyzal 1 tab daily for allergies  Follow up in 6 weeks with Katie Alieyah Spader,NP to see how you're doing back on your inhaler. I will send a message about switching you to one of the providers in [place] so future follow up appointments can be completed there. If symptoms do not improve or worsen, please contact office for sooner follow up or seek emergency care.

## 2022-06-24 NOTE — Telephone Encounter (Signed)
I only saw him once in November 2021 and did not follow-up so it is okay with me if he wants to follow with any other providers in [place].

## 2022-06-24 NOTE — Assessment & Plan Note (Addendum)
Asthma with allergic phenotype. He has had good control with Breo. Has had some increased SOB over the past few weeks d/t being out of his inhaler. Lung exam clear today. We will restart his Breo - refills sent. He has never complete formal PFTs. Unable to obtain spirometry today. We will obtain this at follow up.   Patient Instructions  Restart Breo 1 puff daily. Brush tongue and rinse mouth afterwards Continue Albuterol inhaler 2 puffs every 6 hours as needed for shortness of breath or wheezing. Notify if symptoms persist despite rescue inhaler/neb use.  Continue flonase nasal spray 2 sprays each nostril daily   Start Xyzal 1 tab daily for allergies  Follow up in 6 weeks with Brett Gurdeep Keesey,NP to see how you're doing back on your inhaler. I will send a message about switching you to one of the providers in [place] so future follow up appointments can be completed there. If symptoms do not improve or worsen, please contact office for sooner follow up or seek emergency care.

## 2022-06-24 NOTE — Telephone Encounter (Signed)
Great, thanks

## 2022-06-24 NOTE — Progress Notes (Signed)
@Patient  ID: Brett Guerrero, male    DOB: [birth year], 56 y.o.   MRN: [id number]  Chief Complaint  Patient presents with   Follow-up    Referring provider: Horald Pollen, *  HPI: 56 year old male, never smoker followed for asthma. He is a patient of Dr. Patsey Berthold and last seen in office 08/06/2020. Past medical history significant for elevated PSA with family hx of prostate cancer, ED, allergies.   TEST/EVENTS:  08/2020: positive RAST; IgE 860; eos 300  08/06/2020: OV with Dr. Patsey Berthold. Struggling with SOB, coughing, wheezing. Occasionally feels like his chest is crackling. He was in the [place] and was diagnosed with acute bronchitis in May; treated with abx. He also had mold exposure while undergoing home repairs in June. Cough became cyclical and would "come and go". Albuterol did help as well as cough drops. Triggered by strong odors and moderate exertion. Evaluated at [facility] 9/5 and noted to be wheezing. Treated with albuterol, prednisone taper, mucinex and saline spray. CXR was nl at that time. He does also struggle with significant GERD. Suspected symptoms consistent with development of asthma. Check allergen panel and CBC with diff given concern for allergy induced asthma. PFTs ordered. Trial Breo. GERD likely worsening asthma - started on Nexium.   06/24/2022: Today - follow up Patient presents today for overdue follow up and medication refill. He has been doing well since we saw him last. Feels like the [place] has really helped his breathing. Not coughing very often. He has been out of it for the past few weeks and feels like he has been getting winded more easily. He noticed some wheezing last night as well. He denies any chest congestion, tightness, fevers, leg swelling. He does have allergy type symptoms, which flare during fall and spring. Not currently taking any over the counter antihistamine.   No Known Allergies   There is no immunization history on file for this  patient.  Past Medical History:  Diagnosis Date   Asthma    Family history of colon cancer    brother - 38s   GERD (gastroesophageal reflux disease)    Sciatica of left side     Tobacco History: Social History   Tobacco Use  Smoking Status Never  Smokeless Tobacco Never   Counseling given: Not Answered   Outpatient Medications Prior to Visit  Medication Sig Dispense Refill   fluticasone (FLONASE) 50 MCG/ACT nasal spray Place 2 sprays into both nostrils daily. 16 g 2   fluticasone furoate-vilanterol (BREO ELLIPTA) 200-25 MCG/INH AEPB Inhale 1 puff into the lungs daily. 180 each 0   albuterol (VENTOLIN HFA) 108 (90 Base) MCG/ACT inhaler Inhale into the lungs. (Patient not taking: Reported on 03/07/2022)     esomeprazole (NEXIUM) 40 MG capsule Take 1 capsule (40 mg total) by mouth daily. Take 30 to 60 minutes before a meal 30 capsule 3   sildenafil (VIAGRA) 100 MG tablet Take 0.5-1 tablets (50-100 mg total) by mouth daily as needed for erectile dysfunction. 10 tablet 11   No facility-administered medications prior to visit.     Review of Systems:   Constitutional: No weight loss or gain, night sweats, fevers, chills, fatigue, or lassitude. HEENT: No headaches, difficulty swallowing, tooth/dental problems, or sore throat. No sneezing, itching, ear ache, nasal congestion, or post nasal drip CV:  No chest pain, orthopnea, PND, swelling in lower extremities, anasarca, dizziness, palpitations, syncope Resp: +shortness of breath with exertion; wheezing. No excess mucus or change in color  of mucus. No productive or non-productive. No hemoptysis. No chest wall deformity GI:  No heartburn, indigestion, abdominal pain, nausea, vomiting, diarrhea, change in bowel habits, loss of appetite, bloody stools.  Neuro: No dizziness or lightheadedness.  Psych: No depression or anxiety. Mood stable.     Physical Exam:  BP 120/80 (BP Location: Right Arm)   Pulse 95   Ht 5\' 8"  (1.727 m)   Wt  266 lb 3.2 oz (120.7 kg)   SpO2 96%   BMI 40.48 kg/m   GEN: Pleasant, interactive, well-appearing; morbidly obese; in no acute distress. HEENT:  Normocephalic and atraumatic. PERRLA. Sclera white. Nasal turbinates pink, moist and patent bilaterally. No rhinorrhea present. Oropharynx pink and moist, without exudate or edema. No lesions, ulcerations, or postnasal drip.  NECK:  Supple w/ fair ROM. No JVD present. Normal carotid impulses w/o bruits. Thyroid symmetrical with no goiter or nodules palpated. No lymphadenopathy.   CV: RRR, no m/r/g, no peripheral edema. Pulses intact, +2 bilaterally. No cyanosis, pallor or clubbing. PULMONARY:  Unlabored, regular breathing. Clear bilaterally A&P w/o wheezes/rales/rhonchi. No accessory muscle use. No dullness to percussion. GI: BS present and normoactive. Soft, non-tender to palpation. No organomegaly or masses detected.  MSK: No erythema, warmth or tenderness. Cap refil <2 sec all extrem. No deformities or joint swelling noted.  Neuro: A/Ox3. No focal deficits noted.   Skin: Warm, no lesions or rashe Psych: Normal affect and behavior. Judgement and thought content appropriate.     Lab Results:  CBC    Component Value Date/Time   WBC 5.9 03/01/2022 0830   RBC 4.39 03/01/2022 0830   HGB 13.2 03/01/2022 0830   HGB 13.8 04/07/2020 0807   HCT 39.2 03/01/2022 0830   HCT 42.4 04/07/2020 0807   PLT 206.0 03/01/2022 0830   PLT 190 04/07/2020 0807   MCV 89.5 03/01/2022 0830   MCV 93 04/07/2020 0807   MCH 29.9 08/06/2020 1201   MCHC 33.6 03/01/2022 0830   RDW 14.1 03/01/2022 0830   RDW 13.1 04/07/2020 0807   LYMPHSABS 2.8 03/01/2022 0830   LYMPHSABS 2.5 04/07/2020 0807   MONOABS 0.5 03/01/2022 0830   EOSABS 0.2 03/01/2022 0830   EOSABS 0.2 04/07/2020 0807   BASOSABS 0.0 03/01/2022 0830   BASOSABS 0.0 04/07/2020 0807    BMET    Component Value Date/Time   NA 138 03/01/2022 0830   NA 140 04/07/2020 0807   K 4.3 03/01/2022 0830   CL 102  03/01/2022 0830   CO2 30 03/01/2022 0830   GLUCOSE 108 (H) 03/01/2022 0830   BUN 12 03/01/2022 0830   BUN 10 04/07/2020 0807   CREATININE 0.81 03/01/2022 0830   CALCIUM 9.2 03/01/2022 0830   GFRNONAA 105 04/07/2020 0807   GFRAA 122 04/07/2020 0807    BNP No results found for: "BNP"   Imaging:  MR PROSTATE W WO CONTRAST  Result Date: 06/07/2022 CLINICAL DATA:  Elevated PSA level.  R97.20 EXAM: MR PROSTATE WITHOUT AND WITH CONTRAST TECHNIQUE: Multiplanar multisequence MRI images were obtained of the pelvis centered about the prostate. Pre and post contrast images were obtained. CONTRAST:  10 cc [employer] COMPARISON:  None Available. FINDINGS: Prostate: Diffuse nonfocal reduced T2 signal throughout most of the peripheral zone, likely postinflammatory and considered PI-RADS category 2. No abnormal restricted diffusion or abnormal focal early enhancement. No specific lesion of intermediate or higher suspicion for prostate cancer identified. Volume: 3D volumetric analysis: Prostate volume 26.10 cc (4.4 by 3.3 by 3.9 cm). Transcapsular spread:  Absent Seminal vesicle involvement: Absent Neurovascular bundle involvement: Absent Pelvic adenopathy: Absent Bone metastasis: Absent Other findings: Probable umbilical hernia containing adipose tissue. IMPRESSION: 1. No findings of intermediate or higher suspicion for prostate cancer identified. 2. Suspected umbilical hernia containing adipose tissue. Electronically Signed   By: Van Clines M.D.   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 He has had good control with Breo. Has had some increased SOB over the past few weeks d/t being out of his inhaler. Lung exam clear today. We will restart his Breo - refills sent. He has never complete formal PFTs. Unable to obtain spirometry today. We will obtain  this at follow up.   Patient Instructions  Restart Breo 1 puff daily. Brush tongue and rinse mouth afterwards Continue Albuterol inhaler 2 puffs every 6 hours as needed for shortness of breath or wheezing. Notify if symptoms persist despite rescue inhaler/neb use.  Continue flonase nasal spray 2 sprays each nostril daily   Start Xyzal 1 tab daily for allergies  Follow up in 6 weeks with Katie Mance Vallejo,NP to see how you're doing back on your inhaler. I will send a message about switching you to one of the providers in [place] so future follow up appointments can be completed there. If symptoms do not improve or worsen, please contact office for sooner follow up or seek emergency care.    Allergic rhinitis Significant positives on RAST. Recommended he start antihistamine. Rx for Xyzal sent today. Continue intranasal steroid.    I spent 32 minutes of dedicated to the care of this patient on the date of this encounter to include pre-visit review of records, face-to-face time with the patient discussing conditions above, post visit ordering of testing, clinical documentation with the electronic health record, making appropriate referrals as documented, and communicating necessary findings to members of the patients care team.  Clayton Bibles, NP 06/24/2022  Pt aware and understands NP's role.

## 2022-06-27 DIAGNOSIS — J4530 Mild persistent asthma, uncomplicated: Secondary | ICD-10-CM

## 2022-06-27 MED ORDER — FLUTICASONE FUROATE-VILANTEROL 200-25 MCG/ACT IN AEPB: 1.0000 | INHALATION_SPRAY | Freq: Every day | RESPIRATORY_TRACT | 1 refills | Status: DC

## 2022-06-27 NOTE — Telephone Encounter (Signed)
Patient has been scheduled with Dr. Melvyn Novas in [place] office

## 2022-08-09 ENCOUNTER — Ambulatory Visit: Payer: Self-pay | Admitting: Internal Medicine

## 2022-08-09 NOTE — Progress Notes (Deleted)
   Brett Guerrero, male    DOB: 1966-05-11    MRN: [id number]   Brief patient profile:  ***  yo*** *** referred to pulmonary clinic in [place]  08/09/2022 by *** for ***    TEST/EVENTS:  08/2020: positive RAST; IgE 860; eos 300   History of Present Illness  08/09/2022  Pulmonary/ 1st office eval/ Brett Guerrero / [facility]/ prior pt of Dr Patsey Berthold   No chief complaint on file.    Dyspnea:  *** Cough: *** Sleep: *** SABA use: *** 02: *** Lung cancer screen: ***  Past Medical History:  Diagnosis Date   Asthma    Family history of colon cancer    brother - 55s   GERD (gastroesophageal reflux disease)    Sciatica of left side     Outpatient Medications Prior to Visit  Medication Sig Dispense Refill   albuterol (VENTOLIN HFA) 108 (90 Base) MCG/ACT inhaler Inhale into the lungs. (Patient not taking: Reported on 03/07/2022)     esomeprazole (NEXIUM) 40 MG capsule Take 1 capsule (40 mg total) by mouth daily. Take 30 to 60 minutes before a meal 30 capsule 3   fluticasone (FLONASE) 50 MCG/ACT nasal spray Place 2 sprays into both nostrils daily. 16 g 2   fluticasone furoate-vilanterol (BREO ELLIPTA) 200-25 MCG/ACT AEPB Inhale 1 puff into the lungs daily. 60 each 1   levocetirizine (XYZAL) 5 MG tablet Take 1 tablet (5 mg total) by mouth every evening. 30 tablet 5   sildenafil (VIAGRA) 100 MG tablet Take 0.5-1 tablets (50-100 mg total) by mouth daily as needed for erectile dysfunction. 10 tablet 11   No facility-administered medications prior to visit.     Objective:     There were no vitals taken for this visit.         Assessment   No problem-specific Assessment & Plan notes found for this encounter.     Brett Gully, MD 08/09/2022

## 2022-11-28 DIAGNOSIS — J4530 Mild persistent asthma, uncomplicated: Secondary | ICD-10-CM

## 2022-11-29 MED ORDER — FLUTICASONE FUROATE-VILANTEROL 200-25 MCG/ACT IN AEPB: 1.0000 | INHALATION_SPRAY | Freq: Every day | RESPIRATORY_TRACT | 5 refills | Status: DC

## 2022-12-16 ENCOUNTER — Other Ambulatory Visit: Payer: Self-pay

## 2022-12-16 DIAGNOSIS — J301 Allergic rhinitis due to pollen: Secondary | ICD-10-CM

## 2022-12-16 MED ORDER — LEVOCETIRIZINE DIHYDROCHLORIDE 5 MG PO TABS: 5.0000 mg | ORAL_TABLET | Freq: Every evening | ORAL | 5 refills | Status: AC

## 2023-01-13 ENCOUNTER — Ambulatory Visit
Admission: RE | Admit: 2023-01-13 | Discharge: 2023-01-13 | Disposition: A | Discharge status: Home / Self Care | Payer: Self-pay | Source: Ambulatory Visit | Attending: Nurse Practitioner | Admitting: Nurse Practitioner

## 2023-01-13 VITALS — BP 147/83 | HR 84 | Temp 98.2°F | Resp 18

## 2023-01-13 DIAGNOSIS — J020 Streptococcal pharyngitis: Secondary | ICD-10-CM

## 2023-01-13 LAB — POCT RAPID STREP A (OFFICE): Rapid Strep A Screen: POSITIVE — AB

## 2023-01-13 MED ORDER — LIDOCAINE VISCOUS HCL 2 % MT SOLN: 15.0000 mL | OROMUCOSAL | 0 refills | Status: AC | PRN

## 2023-01-13 MED ORDER — AMOXICILLIN 500 MG PO CAPS: 500.0000 mg | ORAL_CAPSULE | Freq: Two times a day (BID) | ORAL | 0 refills | Status: AC

## 2023-01-13 NOTE — Discharge Instructions (Signed)
You have strep throat.  Take the amoxicillin as prescribed to treat it  You can use the lidocaine rinses as needed for throat pain  Make sure you change your toothbrush today or tomorrow to prevent reinfection  You are considered contagious until you have been on the amoxicillin for 24 hours

## 2023-01-13 NOTE — ED Triage Notes (Signed)
Pt reports he is having throat pain (hurts to swallow) and bilateral ear pain x 4 days.

## 2023-01-13 NOTE — ED Provider Notes (Signed)
[facility]    CSN: [id number] Arrival date & time: 01/13/23  1012      History   Chief Complaint Chief Complaint  Patient presents with   Sore Throat    I have had symptoms of strep throat since Monday 01/09/2023. *Throat pain when swallowing*Hard to swallow*Occasional burning sensation in my throat*Ear aches*No fever - Entered by patient    HPI Brett Guerrero is a 57 y.o. male.   Patient presents today for 4-day history of congested cough, runny and stuffy nose, sore throat, bilateral ear pain.  He denies fever, body aches or chills, dry cough, shortness of breath or chest pain, chest tightness, chest congestion, postnasal drainage, headache, abdominal pain, nausea/vomiting, diarrhea, decreased appetite, and decreased energy levels.  No known sick contacts.  Has not been taken thing for symptoms so far.  Patient reports history of allergies for which she takes Breo inhaler and Nexium.  He denies taking an oral antihistamine or nasal spray currently.  Denies antibiotic use in the past 90 days.  Reports history of strep throat approximately 1 year ago.    Past Medical History:  Diagnosis Date   Asthma    Family history of colon cancer    brother - 90s   GERD (gastroesophageal reflux disease)    Sciatica of left side     Patient Active Problem List   Diagnosis Date Noted   Mild persistent allergic asthma 06/24/2022   Allergic rhinitis 06/24/2022   Elevated PSA 03/07/2022   Family history of prostate cancer 03/07/2022   Erectile dysfunction 03/07/2022   Chronic bronchitis with wheezing 05/01/2020    History reviewed. No pertinent surgical history.     Home Medications    Prior to Admission medications   Medication Sig Start Date End Date Taking? Authorizing Provider  amoxicillin (AMOXIL) 500 MG capsule Take 1 capsule (500 mg total) by mouth 2 (two) times daily for 10 days. 01/13/23 01/23/23 Yes Cathlean Marseilles A, NP  lidocaine (XYLOCAINE) 2 % solution  Use as directed 15 mLs in the mouth or throat every 3 (three) hours as needed for mouth pain. Gargle and spit as needed for throat pain 01/13/23  Yes Cathlean Marseilles A, NP  albuterol (VENTOLIN HFA) 108 (90 Base) MCG/ACT inhaler Inhale into the lungs. Patient not taking: Reported on 03/07/2022 06/07/20   [provider]  esomeprazole (NEXIUM) 40 MG capsule Take 1 capsule (40 mg total) by mouth daily. Take 30 to 60 minutes before a meal 03/01/22   Sagardia, Eilleen Kempf, MD  fluticasone [facility]) 50 MCG/ACT nasal spray Place 2 sprays into both nostrils daily. 03/01/22   Georgina Quint, MD  fluticasone furoate-vilanterol (BREO ELLIPTA) 200-25 MCG/ACT AEPB Inhale 1 puff into the lungs daily. 11/29/22   Cobb, Ruby Cola, NP  levocetirizine (XYZAL) 5 MG tablet Take 1 tablet (5 mg total) by mouth every evening. 12/16/22   Cobb, Ruby Cola, NP  sildenafil (VIAGRA) 100 MG tablet Take 0.5-1 tablets (50-100 mg total) by mouth daily as needed for erectile dysfunction. 03/07/22   Georgina Quint, MD  promethazine (PHENERGAN) 25 MG tablet Take 1 tablet (25 mg total) by mouth every 8 (eight) hours as needed for nausea (headache). Patient not taking: Reported on 10/11/2018 05/07/17 10/22/20  Sharyn Creamer, MD    Family History Family History  Problem Relation Age of Onset   Cancer Mother    Diabetes Mother    Hyperlipidemia Father    Colon cancer Brother 42   Esophageal cancer  Neg Hx    Stomach cancer Neg Hx    Rectal cancer Neg Hx     Social History Social History   Tobacco Use   Smoking status: Never   Smokeless tobacco: Never  Vaping Use   Vaping Use: Never used  Substance Use Topics   Alcohol use: No   Drug use: No     Allergies   Patient has no known allergies.   Review of Systems Review of Systems Per HPI  Physical Exam Triage Vital Signs ED Triage Vitals  Enc Vitals Group     BP 01/13/23 1023 (!) 147/83     Pulse Rate 01/13/23 1023 84     Resp 01/13/23 1023 18      Temp 01/13/23 1023 98.2 F (36.8 C)     Temp Source 01/13/23 1023 Oral     SpO2 01/13/23 1023 96 %     Weight --      Height --      Head Circumference --      Peak Flow --      Pain Score 01/13/23 1026 7     Pain Loc --      Pain Edu? --      Excl. in GC? --    No data found.  Updated Vital Signs BP (!) 147/83 (BP Location: Right Arm)   Pulse 84   Temp 98.2 F (36.8 C) (Oral)   Resp 18   SpO2 96%   Visual Acuity Right Eye Distance:   Left Eye Distance:   Bilateral Distance:    Right Eye Near:   Left Eye Near:    Bilateral Near:     Physical Exam Vitals and nursing note reviewed.  Constitutional:      General: He is not in acute distress.    Appearance: He is well-developed. He is not ill-appearing, toxic-appearing or diaphoretic.  HENT:     Head: Normocephalic and atraumatic.     Right Ear: Ear canal normal. No drainage, swelling or tenderness. A middle ear effusion is present. Tympanic membrane is not erythematous.     Left Ear: Ear canal normal. No drainage, swelling or tenderness. A middle ear effusion is present. Tympanic membrane is not erythematous.     Nose: No congestion or rhinorrhea.     Mouth/Throat:     Mouth: Mucous membranes are dry.     Pharynx: Uvula midline. Posterior oropharyngeal erythema present. No oropharyngeal exudate or uvula swelling.     Tonsils: Tonsillar exudate present. No tonsillar abscesses. 2+ on the right. 2+ on the left.     Comments: Oral airway patent Eyes:     Conjunctiva/sclera: Conjunctivae normal.  Cardiovascular:     Rate and Rhythm: Normal rate and regular rhythm.  Pulmonary:     Effort: Pulmonary effort is normal. No respiratory distress.     Breath sounds: Normal breath sounds. No wheezing, rhonchi or rales.  Musculoskeletal:     Cervical back: Normal range of motion and neck supple.  Lymphadenopathy:     Cervical: No cervical adenopathy.  Skin:    General: Skin is warm and dry.     Coloration: Skin is not pale.      Findings: No erythema or rash.  Neurological:     Mental Status: He is alert and oriented to person, place, and time.  Psychiatric:        Behavior: Behavior is cooperative.      UC Treatments / Results  Labs (all labs ordered  are listed, but only abnormal results are displayed) Labs Reviewed  POCT RAPID STREP A (OFFICE) - Abnormal; Notable for the following components:      Result Value   Rapid Strep A Screen Positive (*)    All other components within normal limits    EKG   Radiology No results found.  Procedures Procedures (including critical care time)  Medications Ordered in UC Medications - No data to display  Initial Impression / Assessment and Plan / UC Course  I have reviewed the triage vital signs and the nursing notes.  Pertinent labs & imaging results that were available during my care of the patient were reviewed by me and considered in my medical decision making (see chart for details).   Patient is well-appearing, normotensive, afebrile, not tachycardic, not tachypneic, oxygenating well on room air.    1. Strep pharyngitis Treat with amoxicillin twice daily for 10 days Change toothbrush after starting treatment Lidocaine rinses given to patient for supportive care Other supportive care discussed including starting Flonase nasal spray to help with bilateral ear effusions ER and return precautions discussed with patient  The patient was given the opportunity to ask questions.  All questions answered to their satisfaction.  The patient is in agreement to this plan.   Final Clinical Impressions(s) / UC Diagnoses   Final diagnoses:  Strep pharyngitis     Discharge Instructions      You have strep throat.  Take the amoxicillin as prescribed to treat it  You can use the lidocaine rinses as needed for throat pain  Make sure you change your toothbrush today or tomorrow to prevent reinfection  You are considered contagious until you have been on  the amoxicillin for 24 hours     ED Prescriptions     Medication Sig Dispense Auth. Provider   amoxicillin (AMOXIL) 500 MG capsule Take 1 capsule (500 mg total) by mouth 2 (two) times daily for 10 days. 20 capsule Cathlean Marseilles A, NP   lidocaine (XYLOCAINE) 2 % solution Use as directed 15 mLs in the mouth or throat every 3 (three) hours as needed for mouth pain. Gargle and spit as needed for throat pain 100 mL Valentino Nose, NP      PDMP not reviewed this encounter.   Valentino Nose, NP 01/13/23 1103

## 2023-04-13 ENCOUNTER — Other Ambulatory Visit: Payer: Self-pay | Admitting: Emergency Medicine

## 2023-06-29 ENCOUNTER — Encounter: Payer: Self-pay | Admitting: Emergency Medicine

## 2023-06-29 ENCOUNTER — Ambulatory Visit: Payer: 59 | Admitting: Emergency Medicine

## 2023-06-29 VITALS — BP 134/64 | HR 73 | Temp 98.3°F | Ht 68.0 in | Wt 249.4 lb

## 2023-06-29 DIAGNOSIS — Z1329 Encounter for screening for other suspected endocrine disorder: Secondary | ICD-10-CM

## 2023-06-29 DIAGNOSIS — Z130 Encounter for screening for diseases of the blood and blood-forming organs and certain disorders involving the immune mechanism: Secondary | ICD-10-CM

## 2023-06-29 DIAGNOSIS — Z87898 Personal history of other specified conditions: Secondary | ICD-10-CM | POA: Diagnosis not present

## 2023-06-29 DIAGNOSIS — Z0000 Encounter for general adult medical examination without abnormal findings: Secondary | ICD-10-CM | POA: Diagnosis not present

## 2023-06-29 DIAGNOSIS — Z125 Encounter for screening for malignant neoplasm of prostate: Secondary | ICD-10-CM

## 2023-06-29 DIAGNOSIS — Z13228 Encounter for screening for other metabolic disorders: Secondary | ICD-10-CM

## 2023-06-29 DIAGNOSIS — Z13220 Encounter for screening for lipoid disorders: Secondary | ICD-10-CM | POA: Diagnosis not present

## 2023-06-29 LAB — COMPREHENSIVE METABOLIC PANEL
ALT: 18 U/L (ref 0–53)
AST: 14 U/L (ref 0–37)
Albumin: 3.5 g/dL (ref 3.5–5.2)
Alkaline Phosphatase: 71 U/L (ref 39–117)
BUN: 10 mg/dL (ref 6–23)
CO2: 31 mEq/L (ref 19–32)
Calcium: 9 mg/dL (ref 8.4–10.5)
Chloride: 104 mEq/L (ref 96–112)
Creatinine, Ser: 0.96 mg/dL (ref 0.40–1.50)
GFR: 88.84 mL/min (ref >60.00)
Glucose, Bld: 105 mg/dL — ABNORMAL HIGH (ref 70–99)
Potassium: 4.6 mEq/L (ref 3.5–5.1)
Sodium: 140 mEq/L (ref 135–145)
Total Bilirubin: 0.9 mg/dL (ref 0.2–1.2)
Total Protein: 7.4 g/dL (ref 6.0–8.3)

## 2023-06-29 LAB — CBC WITH DIFFERENTIAL/PLATELET
Basophils Absolute: 0 10*3/uL (ref 0.0–0.1)
Basophils Relative: 0.3 % (ref 0.0–3.0)
Eosinophils Absolute: 0.2 10*3/uL (ref 0.0–0.7)
Eosinophils Relative: 3.2 % (ref 0.0–5.0)
HCT: 42.8 % (ref 39.0–52.0)
Hemoglobin: 13.9 g/dL (ref 13.0–17.0)
Lymphocytes Relative: 49.7 % — ABNORMAL HIGH (ref 12.0–46.0)
Lymphs Abs: 2.5 10*3/uL (ref 0.7–4.0)
MCHC: 32.5 g/dL (ref 30.0–36.0)
MCV: 90.9 fl (ref 78.0–100.0)
Monocytes Absolute: 0.5 10*3/uL (ref 0.1–1.0)
Monocytes Relative: 10.3 % (ref 3.0–12.0)
Neutro Abs: 1.9 10*3/uL (ref 1.4–7.7)
Neutrophils Relative %: 36.5 % — ABNORMAL LOW (ref 43.0–77.0)
Platelets: 209 10*3/uL (ref 150.0–400.0)
RBC: 4.71 Mil/uL (ref 4.22–5.81)
RDW: 12.9 % (ref 11.5–15.5)
WBC: 5.1 10*3/uL (ref 4.0–10.5)

## 2023-06-29 LAB — LIPID PANEL
Cholesterol: 127 mg/dL (ref 0–200)
HDL: 36.7 mg/dL — ABNORMAL LOW (ref >39.00)
LDL Cholesterol: 78 mg/dL (ref 0–99)
NonHDL: 90.5
Total CHOL/HDL Ratio: 3
Triglycerides: 63 mg/dL (ref 0.0–149.0)
VLDL: 12.6 mg/dL (ref 0.0–40.0)

## 2023-06-29 LAB — HEMOGLOBIN A1C: Hgb A1c MFr Bld: 6 % (ref 4.6–6.5)

## 2023-06-29 LAB — PSA: PSA: 1.59 ng/mL (ref 0.10–4.00)

## 2023-06-29 NOTE — Patient Instructions (Signed)
Health Maintenance, Male Adopting a healthy lifestyle and getting preventive care are important in promoting health and wellness. Ask your health care provider about: The right schedule for you to have regular tests and exams. Things you can do on your own to prevent diseases and keep yourself healthy. What should I know about diet, weight, and exercise? Eat a healthy diet  Eat a diet that includes plenty of vegetables, fruits, low-fat dairy products, and lean protein. Do not eat a lot of foods that are high in solid fats, added sugars, or sodium. Maintain a healthy weight Body mass index (BMI) is a measurement that can be used to identify possible weight problems. It estimates body fat based on height and weight. Your health care provider can help determine your BMI and help you achieve or maintain a healthy weight. Get regular exercise Get regular exercise. This is one of the most important things you can do for your health. Most adults should: Exercise for at least 150 minutes each week. The exercise should increase your heart rate and make you sweat (moderate-intensity exercise). Do strengthening exercises at least twice a week. This is in addition to the moderate-intensity exercise. Spend less time sitting. Even light physical activity can be beneficial. Watch cholesterol and blood lipids Have your blood tested for lipids and cholesterol at 56 years of age, then have this test every 5 years. You may need to have your cholesterol levels checked more often if: Your lipid or cholesterol levels are high. You are older than 56 years of age. You are at high risk for heart disease. What should I know about cancer screening? Many types of cancers can be detected early and may often be prevented. Depending on your health history and family history, you may need to have cancer screening at various ages. This may include screening for: Colorectal cancer. Prostate cancer. Skin cancer. Lung  cancer. What should I know about heart disease, diabetes, and high blood pressure? Blood pressure and heart disease High blood pressure causes heart disease and increases the risk of stroke. This is more likely to develop in people who have high blood pressure readings or are overweight. Talk with your health care provider about your target blood pressure readings. Have your blood pressure checked: Every 3-5 years if you are 18-39 years of age. Every year if you are 40 years old or older. If you are between the ages of 65 and 75 and are a current or former smoker, ask your health care provider if you should have a one-time screening for abdominal aortic aneurysm (AAA). Diabetes Have regular diabetes screenings. This checks your fasting blood sugar level. Have the screening done: Once every three years after age 45 if you are at a normal weight and have a low risk for diabetes. More often and at a younger age if you are overweight or have a high risk for diabetes. What should I know about preventing infection? Hepatitis B If you have a higher risk for hepatitis B, you should be screened for this virus. Talk with your health care provider to find out if you are at risk for hepatitis B infection. Hepatitis C Blood testing is recommended for: Everyone born from 1945 through 1965. Anyone with known risk factors for hepatitis C. Sexually transmitted infections (STIs) You should be screened each year for STIs, including gonorrhea and chlamydia, if: You are sexually active and are younger than 56 years of age. You are older than 56 years of age and your   health care provider tells you that you are at risk for this type of infection. Your sexual activity has changed since you were last screened, and you are at increased risk for chlamydia or gonorrhea. Ask your health care provider if you are at risk. Ask your health care provider about whether you are at high risk for HIV. Your health care provider  may recommend a prescription medicine to help prevent HIV infection. If you choose to take medicine to prevent HIV, you should first get tested for HIV. You should then be tested every 3 months for as long as you are taking the medicine. Follow these instructions at home: Alcohol use Do not drink alcohol if your health care provider tells you not to drink. If you drink alcohol: Limit how much you have to 0-2 drinks a day. Know how much alcohol is in your drink. In the [place] one drink equals one 12 oz bottle of beer (355 mL), one 5 oz glass of wine (148 mL), or one 1 oz glass of hard liquor (44 mL). Lifestyle Do not use any products that contain nicotine or tobacco. These products include cigarettes, chewing tobacco, and vaping devices, such as e-cigarettes. If you need help quitting, ask your health care provider. Do not use street drugs. Do not share needles. Ask your health care provider for help if you need support or information about quitting drugs. General instructions Schedule regular health, dental, and eye exams. Stay current with your vaccines. Tell your health care provider if: You often feel depressed. You have ever been abused or do not feel safe at home. Summary Adopting a healthy lifestyle and getting preventive care are important in promoting health and wellness. Follow your health care provider's instructions about healthy diet, exercising, and getting tested or screened for diseases. Follow your health care provider's instructions on monitoring your cholesterol and blood pressure. This information is not intended to replace advice given to you by your health care provider. Make sure you discuss any questions you have with your health care provider. Document Revised: 02/08/2021 Document Reviewed: 02/08/2021 Elsevier Patient Education  2024 [employer]  

## 2023-06-29 NOTE — Progress Notes (Signed)
Brett Guerrero 57 y.o.   Chief Complaint  Patient presents with   Annual Exam    No concerns     HISTORY OF PRESENT ILLNESS: This is a 57 y.o. male here for annual exam. Overall doing well.  Has no complaints or medical concerns today. History of elevated PSA.  Negative prostate cancer workup. CLINICAL DATA:  Elevated PSA level.  R97.20   EXAM: MR PROSTATE WITHOUT AND WITH CONTRAST   TECHNIQUE: Multiplanar multisequence MRI images were obtained of the pelvis centered about the prostate. Pre and post contrast images were obtained.   CONTRAST:  10 cc Vueway   COMPARISON:  None Available.   FINDINGS: Prostate: Diffuse nonfocal reduced T2 signal throughout most of the peripheral zone, likely postinflammatory and considered PI-RADS category 2.   No abnormal restricted diffusion or abnormal focal early enhancement. No specific lesion of intermediate or higher suspicion for prostate cancer identified.   Volume: 3D volumetric analysis: Prostate volume 26.10 cc (4.4 by 3.3 by 3.9 cm).   Transcapsular spread:  Absent   Seminal vesicle involvement: Absent   Neurovascular bundle involvement: Absent   Pelvic adenopathy: Absent   Bone metastasis: Absent   Other findings: Probable umbilical hernia containing adipose tissue.   IMPRESSION: 1. No findings of intermediate or higher suspicion for prostate cancer identified. 2. Suspected umbilical hernia containing adipose tissue.     Electronically Signed   By: Gaylyn Rong M.D.   On: 06/07/2022 08:02    HPI   Prior to Admission medications   Medication Sig Start Date End Date Taking? Authorizing Provider  fluticasone (FLONASE) 50 MCG/ACT nasal spray Use 2 spray(s) in each nostril once daily 04/14/23  Yes Baylynn Shifflett, Eilleen Kempf, MD  levocetirizine (XYZAL) 5 MG tablet Take 1 tablet (5 mg total) by mouth every evening. 12/16/22  Yes Cobb, Ruby Cola, NP  sildenafil (VIAGRA) 100 MG tablet TAKE 1/2 TO 1 (ONE-HALF TO  ONE) TABLET BY MOUTH ONCE DAILY AS NEEDED FOR ERECTILE DYSFUNCTION 04/14/23  Yes Careena Degraffenreid, Eilleen Kempf, MD  albuterol (VENTOLIN HFA) 108 (90 Base) MCG/ACT inhaler Inhale into the lungs. Patient not taking: Reported on 03/07/2022 06/07/20   [provider]  esomeprazole (NEXIUM) 40 MG capsule Take 1 capsule (40 mg total) by mouth daily. Take 30 to 60 minutes before a meal Patient not taking: Reported on 06/29/2023 03/01/22   Georgina Quint, MD  fluticasone furoate-vilanterol (BREO ELLIPTA) 200-25 MCG/ACT AEPB Inhale 1 puff into the lungs daily. Patient not taking: Reported on 06/29/2023 11/29/22   Noemi Chapel, NP  lidocaine (XYLOCAINE) 2 % solution Use as directed 15 mLs in the mouth or throat every 3 (three) hours as needed for mouth pain. Gargle and spit as needed for throat pain 01/13/23   Valentino Nose, NP  promethazine (PHENERGAN) 25 MG tablet Take 1 tablet (25 mg total) by mouth every 8 (eight) hours as needed for nausea (headache). Patient not taking: Reported on 10/11/2018 05/07/17 10/22/20  Sharyn Creamer, MD    No Known Allergies  Patient Active Problem List   Diagnosis Date Noted   Mild persistent allergic asthma 06/24/2022   Allergic rhinitis 06/24/2022   Elevated PSA 03/07/2022   Family history of prostate cancer 03/07/2022   Erectile dysfunction 03/07/2022   Chronic bronchitis with wheezing (HCC) 05/01/2020    Past Medical History:  Diagnosis Date   Asthma    Family history of colon cancer    brother - 31s   GERD (gastroesophageal reflux disease)  Sciatica of left side     No past surgical history on file.  Social History   Socioeconomic History   Marital status: Married    Spouse name: Not on file   Number of children: Not on file   Years of education: Not on file   Highest education level: Not on file  Occupational History   Not on file  Tobacco Use   Smoking status: Never   Smokeless tobacco: Never  Vaping Use   Vaping status: Never Used   Substance and Sexual Activity   Alcohol use: No   Drug use: No   Sexual activity: Not on file  Other Topics Concern   Not on file  Social History Narrative   Not on file   Social Determinants of Health   Financial Resource Strain: Not on file  Food Insecurity: Not on file  Transportation Needs: Not on file  Physical Activity: Not on file  Stress: Not on file  Social Connections: Not on file  Intimate Partner Violence: Not on file    Family History  Problem Relation Age of Onset   Cancer Mother    Diabetes Mother    Hyperlipidemia Father    Colon cancer Brother 35   Esophageal cancer Neg Hx    Stomach cancer Neg Hx    Rectal cancer Neg Hx      Review of Systems  Constitutional: Negative.  Negative for chills and fever.  HENT: Negative.  Negative for congestion and sore throat.   Respiratory: Negative.  Negative for cough and shortness of breath.   Cardiovascular: Negative.  Negative for chest pain and palpitations.  Gastrointestinal:  Negative for abdominal pain, diarrhea, nausea and vomiting.  Genitourinary: Negative.  Negative for dysuria and hematuria.  Skin: Negative.  Negative for rash.  Neurological: Negative.  Negative for dizziness and headaches.  All other systems reviewed and are negative.   Vitals:   06/29/23 0924  BP: 134/64  Pulse: 73  Temp: 98.3 F (36.8 C)  SpO2: 95%    Physical Exam Vitals reviewed.  Constitutional:      Appearance: Normal appearance.  HENT:     Head: Normocephalic.     Right Ear: Tympanic membrane, ear canal and external ear normal.     Left Ear: Tympanic membrane, ear canal and external ear normal.     Mouth/Throat:     Mouth: Mucous membranes are moist.     Pharynx: Oropharynx is clear.  Eyes:     Extraocular Movements: Extraocular movements intact.     Conjunctiva/sclera: Conjunctivae normal.     Pupils: Pupils are equal, round, and reactive to light.  Cardiovascular:     Rate and Rhythm: Normal rate and  regular rhythm.     Pulses: Normal pulses.     Heart sounds: Normal heart sounds.  Pulmonary:     Effort: Pulmonary effort is normal.     Breath sounds: Normal breath sounds.  Abdominal:     Palpations: Abdomen is soft.     Tenderness: There is no abdominal tenderness.  Musculoskeletal:     Cervical back: No tenderness.  Lymphadenopathy:     Cervical: No cervical adenopathy.  Skin:    General: Skin is warm and dry.     Capillary Refill: Capillary refill takes less than 2 seconds.  Neurological:     General: No focal deficit present.     Mental Status: He is alert and oriented to person, place, and time.  Psychiatric:  Mood and Affect: Mood normal.        Behavior: Behavior normal.      ASSESSMENT & PLAN: Problem List Items Addressed This Visit       Other   History of elevated PSA   Other Visit Diagnoses     Routine general medical examination at a health care facility    -  Primary   Relevant Orders   CBC with Differential   Comprehensive metabolic panel   Hemoglobin A1c   Lipid panel   PSA(Must document that pt has been informed of limitations of PSA testing.)   Screening for prostate cancer       Relevant Orders   PSA(Must document that pt has been informed of limitations of PSA testing.)   Screening for deficiency anemia       Relevant Orders   CBC with Differential   Screening for lipoid disorders       Relevant Orders   Lipid panel   Screening for endocrine, metabolic and immunity disorder       Relevant Orders   Comprehensive metabolic panel   Hemoglobin A1c        Modifiable risk factors discussed with patient. Anticipatory guidance according to age provided. The following topics were also discussed: Social Determinants of Health Smoking.  Non-smoker Diet and nutrition and need to decrease amount of daily carbohydrate intake and daily calories and increase amount of plant-based protein in his diet Benefits of exercise Cancer screening  and review of most recent colonoscopy report Vaccinations review and recommendations Cardiovascular risk assessment The 10-year ASCVD risk score (Arnett DK, et al., 2019) is: 7.1%   Values used to calculate the score:     Age: 61 years     Sex: Male     Is Non-Hispanic African American: Yes     Diabetic: No     Tobacco smoker: No     Systolic Blood Pressure: 134 mmHg     Is BP treated: No     HDL Cholesterol: 40.2 mg/dL     Total Cholesterol: 153 mg/dL  Mental health including depression and anxiety Fall and accident prevention  Patient Instructions  Health Maintenance, Male Adopting a healthy lifestyle and getting preventive care are important in promoting health and wellness. Ask your health care provider about: The right schedule for you to have regular tests and exams. Things you can do on your own to prevent diseases and keep yourself healthy. What should I know about diet, weight, and exercise? Eat a healthy diet  Eat a diet that includes plenty of vegetables, fruits, low-fat dairy products, and lean protein. Do not eat a lot of foods that are high in solid fats, added sugars, or sodium. Maintain a healthy weight Body mass index (BMI) is a measurement that can be used to identify possible weight problems. It estimates body fat based on height and weight. Your health care provider can help determine your BMI and help you achieve or maintain a healthy weight. Get regular exercise Get regular exercise. This is one of the most important things you can do for your health. Most adults should: Exercise for at least 150 minutes each week. The exercise should increase your heart rate and make you sweat (moderate-intensity exercise). Do strengthening exercises at least twice a week. This is in addition to the moderate-intensity exercise. Spend less time sitting. Even light physical activity can be beneficial. Watch cholesterol and blood lipids Have your blood tested for lipids and  cholesterol at  56 years of age, then have this test every 5 years. You may need to have your cholesterol levels checked more often if: Your lipid or cholesterol levels are high. You are older than 56 years of age. You are at high risk for heart disease. What should I know about cancer screening? Many types of cancers can be detected early and may often be prevented. Depending on your health history and family history, you may need to have cancer screening at various ages. This may include screening for: Colorectal cancer. Prostate cancer. Skin cancer. Lung cancer. What should I know about heart disease, diabetes, and high blood pressure? Blood pressure and heart disease High blood pressure causes heart disease and increases the risk of stroke. This is more likely to develop in people who have high blood pressure readings or are overweight. Talk with your health care provider about your target blood pressure readings. Have your blood pressure checked: Every 3-5 years if you are 38-16 years of age. Every year if you are 38 years old or older. If you are between the ages of 50 and 5 and are a current or former smoker, ask your health care provider if you should have a one-time screening for abdominal aortic aneurysm (AAA). Diabetes Have regular diabetes screenings. This checks your fasting blood sugar level. Have the screening done: Once every three years after age 53 if you are at a normal weight and have a low risk for diabetes. More often and at a younger age if you are overweight or have a high risk for diabetes. What should I know about preventing infection? Hepatitis B If you have a higher risk for hepatitis B, you should be screened for this virus. Talk with your health care provider to find out if you are at risk for hepatitis B infection. Hepatitis C Blood testing is recommended for: Everyone born from [birth year] through 1964. Anyone with known risk factors for hepatitis C. Sexually  transmitted infections (STIs) You should be screened each year for STIs, including gonorrhea and chlamydia, if: You are sexually active and are younger than 56 years of age. You are older than 56 years of age and your health care provider tells you that you are at risk for this type of infection. Your sexual activity has changed since you were last screened, and you are at increased risk for chlamydia or gonorrhea. Ask your health care provider if you are at risk. Ask your health care provider about whether you are at high risk for HIV. Your health care provider may recommend a prescription medicine to help prevent HIV infection. If you choose to take medicine to prevent HIV, you should first get tested for HIV. You should then be tested every 3 months for as long as you are taking the medicine. Follow these instructions at home: Alcohol use Do not drink alcohol if your health care provider tells you not to drink. If you drink alcohol: Limit how much you have to 0-2 drinks a day. Know how much alcohol is in your drink. In the [place] one drink equals one 12 oz bottle of beer (355 mL), one 5 oz glass of wine (148 mL), or one 1 oz glass of hard liquor (44 mL). Lifestyle Do not use any products that contain nicotine or tobacco. These products include cigarettes, chewing tobacco, and vaping devices, such as e-cigarettes. If you need help quitting, ask your health care provider. Do not use street drugs. Do not share needles. Ask your health care  provider for help if you need support or information about quitting drugs. General instructions Schedule regular health, dental, and eye exams. Stay current with your vaccines. Tell your health care provider if: You often feel depressed. You have ever been abused or do not feel safe at home. Summary Adopting a healthy lifestyle and getting preventive care are important in promoting health and wellness. Follow your health care provider's instructions about  healthy diet, exercising, and getting tested or screened for diseases. Follow your health care provider's instructions on monitoring your cholesterol and blood pressure. This information is not intended to replace advice given to you by your health care provider. Make sure you discuss any questions you have with your health care provider. Document Revised: 02/08/2021 Document Reviewed: 02/08/2021 Elsevier Patient Education  2024 Elsevier Inc.      Edwina Barth, MD [facility]

## 2023-07-03 ENCOUNTER — Telehealth: Payer: Self-pay | Admitting: Emergency Medicine

## 2023-07-03 NOTE — Telephone Encounter (Signed)
Patient dropped off document Physician Certification Form, to be filled out by provider. Patient requested to send it back via Call Patient to pick up within 7-days. Document is located in providers tray at front office.Please advise at [facility] [phone]

## 2023-07-05 NOTE — Telephone Encounter (Signed)
Formed received, placed in provider box in office to sign

## 2023-07-07 NOTE — Telephone Encounter (Signed)
Called patient and was unable to leave message VM not set up... patient physical form is ready for pick up, will be upfront

## 2023-07-13 ENCOUNTER — Other Ambulatory Visit: Payer: Self-pay | Admitting: Emergency Medicine

## 2023-07-14 MED ORDER — FLUTICASONE PROPIONATE 50 MCG/ACT NA SUSP: 1.0000 | Freq: Every day | NASAL | 0 refills | Status: DC

## 2023-08-30 ENCOUNTER — Other Ambulatory Visit: Payer: Self-pay | Admitting: Emergency Medicine

## 2023-08-30 MED ORDER — SILDENAFIL CITRATE 100 MG PO TABS: 100.0000 mg | ORAL_TABLET | ORAL | 0 refills | Status: DC | PRN

## 2023-08-30 MED ORDER — FLUTICASONE PROPIONATE 50 MCG/ACT NA SUSP: 1.0000 | Freq: Every day | NASAL | 0 refills | Status: DC

## 2023-09-01 ENCOUNTER — Other Ambulatory Visit: Payer: Self-pay | Admitting: Emergency Medicine

## 2023-09-06 MED ORDER — ALBUTEROL SULFATE HFA 108 (90 BASE) MCG/ACT IN AERS
1.0000 | INHALATION_SPRAY | RESPIRATORY_TRACT | 1 refills | Status: DC | PRN
Start: 2023-09-06 — End: 2024-02-29

## 2023-12-12 ENCOUNTER — Other Ambulatory Visit: Payer: Self-pay | Admitting: Emergency Medicine

## 2023-12-14 MED ORDER — SILDENAFIL CITRATE 100 MG PO TABS: 100.0000 mg | ORAL_TABLET | ORAL | 0 refills | Status: DC | PRN

## 2024-01-17 ENCOUNTER — Ambulatory Visit
Admission: RE | Admit: 2024-01-17 | Discharge: 2024-01-17 | Disposition: A | Discharge status: Home / Self Care | Source: Ambulatory Visit | Attending: Nurse Practitioner | Admitting: Nurse Practitioner

## 2024-01-17 ENCOUNTER — Other Ambulatory Visit: Payer: Self-pay

## 2024-01-17 VITALS — BP 117/76 | HR 84 | Temp 98.0°F | Resp 20

## 2024-01-17 DIAGNOSIS — H1032 Unspecified acute conjunctivitis, left eye: Secondary | ICD-10-CM

## 2024-01-17 MED ORDER — ERYTHROMYCIN 5 MG/GM OP OINT
TOPICAL_OINTMENT | Freq: Four times a day (QID) | OPHTHALMIC | 0 refills | Status: AC
Start: 2024-01-17 — End: 2024-01-22

## 2024-01-17 NOTE — ED Triage Notes (Addendum)
 Pt reports left eye swelling and drainage x1 week. Denies any visual changes, known injury, or foreign body.

## 2024-01-17 NOTE — ED Provider Notes (Signed)
 [facility]    CSN: [id number] Arrival date & time: 01/17/24  0845      History   Chief Complaint Chief Complaint  Patient presents with   Eye Problem    Left eye slightly swollen with mild pain...above normal discharge. - Entered by patient    HPI Brett Guerrero is a 58 y.o. male.   Patient presents today with 6 day history of left eye swelling, yellow/white eye drainage, and eye pain when he closes his left eye.  No eye itching, matting of the eyelids, or foreign body sensation of the left eye.  He does not wear contact lens.  No vision changes, blurred or double vision, headache, or floaters in his vision.  No pain with the movement of the eye or fever, nausea/vomiting.  Denies history of eye surgeries.      Past Medical History:  Diagnosis Date   Asthma    Family history of colon cancer    brother - 85s   GERD (gastroesophageal reflux disease)    Sciatica of left side     Patient Active Problem List   Diagnosis Date Noted   History of elevated PSA 06/29/2023   Mild persistent allergic asthma 06/24/2022   Allergic rhinitis 06/24/2022   Elevated PSA 03/07/2022   Family history of prostate cancer 03/07/2022   Erectile dysfunction 03/07/2022   Chronic bronchitis with wheezing (HCC) 05/01/2020    History reviewed. No pertinent surgical history.     Home Medications    Prior to Admission medications   Medication Sig Start Date End Date Taking? Authorizing Provider  erythromycin ophthalmic ointment Place into the left eye 4 (four) times daily for 5 days. Place a 1/2 inch ribbon of ointment into the lower eyelid. 01/17/24 01/22/24 Yes Valentino Nose, NP  albuterol (VENTOLIN HFA) 108 (90 Base) MCG/ACT inhaler Inhale 1-2 puffs into the lungs every 4 (four) hours as needed for wheezing or shortness of breath. 09/06/23   Georgina Quint, MD  esomeprazole (NEXIUM) 40 MG capsule Take 1 capsule (40 mg total) by mouth daily. Take 30 to 60 minutes before a  meal Patient not taking: Reported on 06/29/2023 03/01/22   Georgina Quint, MD  fluticasone [facility]) 50 MCG/ACT nasal spray Place 1 spray into both nostrils daily. 08/30/23   Georgina Quint, MD  fluticasone furoate-vilanterol (BREO ELLIPTA) 200-25 MCG/ACT AEPB Inhale 1 puff into the lungs daily. Patient not taking: Reported on 06/29/2023 11/29/22   Noemi Chapel, NP  levocetirizine (XYZAL) 5 MG tablet Take 1 tablet (5 mg total) by mouth every evening. 12/16/22   Cobb, Ruby Cola, NP  lidocaine (XYLOCAINE) 2 % solution Use as directed 15 mLs in the mouth or throat every 3 (three) hours as needed for mouth pain. Gargle and spit as needed for throat pain 01/13/23   Cathlean Marseilles A, NP  sildenafil (VIAGRA) 100 MG tablet Take 1 tablet (100 mg total) by mouth as needed for erectile dysfunction. 12/14/23   Georgina Quint, MD  promethazine (PHENERGAN) 25 MG tablet Take 1 tablet (25 mg total) by mouth every 8 (eight) hours as needed for nausea (headache). Patient not taking: Reported on 10/11/2018 05/07/17 10/22/20  Sharyn Creamer, MD    Family History Family History  Problem Relation Age of Onset   Cancer Mother    Diabetes Mother    Hyperlipidemia Father    Colon cancer Brother 59   Esophageal cancer Neg Hx    Stomach cancer Neg Hx  Rectal cancer Neg Hx     Social History Social History   Tobacco Use   Smoking status: Never   Smokeless tobacco: Never  Vaping Use   Vaping status: Never Used  Substance Use Topics   Alcohol use: No   Drug use: No     Allergies   Patient has no known allergies.   Review of Systems Review of Systems Per HPI  Physical Exam Triage Vital Signs ED Triage Vitals [01/17/24 0858]  Encounter Vitals Group     BP 117/76     Systolic BP Percentile      Diastolic BP Percentile      Pulse Rate 84     Resp 20     Temp 98 F (36.7 C)     Temp Source Oral     SpO2 94 %     Weight      Height      Head Circumference      Peak Flow       Pain Score 2     Pain Loc      Pain Education      Exclude from Growth Chart    No data found.  Updated Vital Signs BP 117/76 (BP Location: Right Arm)   Pulse 84   Temp 98 F (36.7 C) (Oral)   Resp 20   SpO2 94%   Visual Acuity Right Eye Distance:   Left Eye Distance:   Bilateral Distance:    Right Eye Near:   Left Eye Near:    Bilateral Near:     Physical Exam Vitals and nursing note reviewed.  Constitutional:      General: He is not in acute distress.    Appearance: Normal appearance. He is not toxic-appearing.  HENT:     Head: Normocephalic and atraumatic.     Mouth/Throat:     Mouth: Mucous membranes are moist.     Pharynx: Oropharynx is clear.  Eyes:     General: Lids are normal. No visual field deficit or scleral icterus.       Right eye: No discharge.        Left eye: Discharge present.    Extraocular Movements: Extraocular movements intact.     Right eye: Normal extraocular motion.     Left eye: Normal extraocular motion.     Pupils: Pupils are equal, round, and reactive to light.     Comments: Minimal left eye discharge that appears clear/white.  There is minimal edema to the lower eyelid.   Neurological:     Mental Status: He is alert.      UC Treatments / Results  Labs (all labs ordered are listed, but only abnormal results are displayed) Labs Reviewed - No data to display  EKG   Radiology No results found.  Procedures Procedures (including critical care time)  Medications Ordered in UC Medications - No data to display  Initial Impression / Assessment and Plan / UC Course  I have reviewed the triage vital signs and the nursing notes.  Pertinent labs & imaging results that were available during my care of the patient were reviewed by me and considered in my medical decision making (see chart for details).   Patient is well-appearing, normotensive, afebrile, not tachycardic, not tachypneic, oxygenating well on room air.    1.  Acute conjunctivitis of left eye, unspecified acute conjunctivitis type Will treat for conjunctivitis with erythromycin ointment Recommended follow up with Ophthalmologist if symptoms do not improve with  treatment   The patient was given the opportunity to ask questions.  All questions answered to their satisfaction.  The patient is in agreement to this plan.   Final Clinical Impressions(s) / UC Diagnoses   Final diagnoses:  Acute conjunctivitis of left eye, unspecified acute conjunctivitis type     Discharge Instructions      Start using the erythromycin ointment in your left eye four times daily for 5 days to treat for bacterial infection of the eye.  Recommend follow up with an eye doctor if the symptoms do not improve with treatment.  Contact information has been provided.   ED Prescriptions     Medication Sig Dispense Auth. Provider   erythromycin ophthalmic ointment Place into the left eye 4 (four) times daily for 5 days. Place a 1/2 inch ribbon of ointment into the lower eyelid. 3.5 g Wilhemena Harbour, NP      PDMP not reviewed this encounter.   Wilhemena Harbour, NP 01/17/24 1008

## 2024-01-17 NOTE — Discharge Instructions (Addendum)
 Start using the erythromycin ointment in your left eye four times daily for 5 days to treat for bacterial infection of the eye.  Recommend follow up with an eye doctor if the symptoms do not improve with treatment.  Contact information has been provided.

## 2024-02-19 ENCOUNTER — Other Ambulatory Visit: Payer: Self-pay | Admitting: Emergency Medicine

## 2024-02-19 ENCOUNTER — Other Ambulatory Visit: Payer: Self-pay | Admitting: Nurse Practitioner

## 2024-02-19 DIAGNOSIS — J4530 Mild persistent asthma, uncomplicated: Secondary | ICD-10-CM

## 2024-02-28 ENCOUNTER — Ambulatory Visit: Payer: Self-pay | Admitting: Emergency Medicine

## 2024-02-28 NOTE — Telephone Encounter (Signed)
 Chief Complaint: Difficulty breathing since 2023  Symptoms: SOB Pertinent Negatives: Patient denies dizziness  Disposition:  [x] Appointment(In office)  Additional Notes: Patient states he ran out of his Breo inhaler and would like a refill sent to the below pharmacy. Pt states his difficulty breathing has not gotten worse. Pt states in past three days he started wheezing. Pt scheduled for first available appointment tomorrow at [facility] location as pt states he cannot leave work early today. This RN educated pt on new-worsening symptoms and when to call back/seek emergent care. Pt verbalized understanding and agrees to plan.   [facility] [address], [place] - [address], [place] [address] Phone: [phone]  Fax: [phone]     Copied from CRM [id number]. Topic: Clinical - Red Word Triage >> Feb 28, 2024 11:42 AM Ambrose Junk wrote: [facility] that prompted transfer to Nurse Triage: Diff Breathing Reason for Disposition  [1] MODERATE longstanding difficulty breathing (e.g., speaks in phrases, SOB even at rest, pulse 100-120) AND [2] SAME as normal  Answer Assessment - Initial Assessment Questions RESPIRATORY STATUS: "Describe your breathing?" (e.g., wheezing, shortness of breath, unable to speak, severe coughing)      SOB ONSET: "When did this breathing problem begin?"      2023 PATTERN "Does the difficult breathing come and go, or has it been constant since it started?"      Intermittent now constant in past 24 hours SEVERITY: "How bad is your breathing?" (e.g., mild, moderate, severe)    - MILD: No SOB at rest, mild SOB with walking, speaks normally in sentences, can lie down, no retractions, pulse < 100.    - MODERATE: SOB at rest, SOB with minimal exertion and prefers to sit, cannot lie down flat, speaks in phrases, mild retractions, audible wheezing, pulse 100-120.    - SEVERE: Very SOB at rest, speaks in single words, struggling to breathe,  sitting hunched forward, retractions, pulse > 120      Mild to moderate O2 SATURATION MONITOR:  "Do you use an oxygen saturation monitor (pulse oximeter) at home?" If Yes, ask: "What is your reading (oxygen level) today?" "What is your usual oxygen saturation reading?" (e.g., 95%)       Patient does not have one  Protocols used: Breathing Difficulty-A-AH

## 2024-02-29 ENCOUNTER — Ambulatory Visit (INDEPENDENT_AMBULATORY_CARE_PROVIDER_SITE_OTHER): Admitting: Internal Medicine

## 2024-02-29 ENCOUNTER — Encounter: Payer: Self-pay | Admitting: Internal Medicine

## 2024-02-29 VITALS — BP 142/88 | HR 71 | Ht 68.0 in | Wt 246.0 lb

## 2024-02-29 DIAGNOSIS — Z87891 Personal history of nicotine dependence: Secondary | ICD-10-CM | POA: Diagnosis not present

## 2024-02-29 DIAGNOSIS — J4530 Mild persistent asthma, uncomplicated: Secondary | ICD-10-CM

## 2024-02-29 MED ORDER — BUDESONIDE-FORMOTEROL FUMARATE 160-4.5 MCG/ACT IN AERO: INHALATION_SPRAY | RESPIRATORY_TRACT | 11 refills | Status: AC

## 2024-02-29 MED ORDER — BREZTRI AEROSPHERE 160-9-4.8 MCG/ACT IN AERO: 2.0000 | INHALATION_SPRAY | Freq: Two times a day (BID) | RESPIRATORY_TRACT | Status: AC

## 2024-02-29 NOTE — Assessment & Plan Note (Signed)
 Onset in his 11s with intermittent symptoms - IgE  08/06/20 = 860 - 02/29/2024  After extensive coaching inhaler device,  effectiveness =    80% with hfa > start symbiocrt 160 x 3 m then regroup and consider the 80 2bid prn Based on two studies from NEJM  378; 20 p 1865 (2018) and 380 : p2020-30 (2019) in pts with mild asthma it is reasonable to use low dose symbicort eg 80 2bid "prn" flare in this setting but I emphasized this was only shown with symbicort and takes advantage of the rapid onset of action but is not the same as "rescue therapy" but can be stopped once the acute symptoms have resolved and the need for rescue has been minimized (< 2 x weekly)    >>>  His is actively wheezing with drop in ex tol so plan to get full control of airway inflammation and bronchospasm at full dose symbicort (breztri sample) = 160 2bid x one week then ok to taper if doing great  F/u 3 months - call sooner if needed      Each maintenance medication was reviewed in detail including emphasizing most importantly the difference between maintenance and prns and under what circumstances the prns are to be triggered using an action plan format where appropriate.  Total time for H and P, chart review, counseling, reviewing hfa  device(s) and generating customized AVS unique to this office visit / same day charting = 40 min with pt new to me

## 2024-02-29 NOTE — Addendum Note (Signed)
 Addended by: Michael Ades T on: 02/29/2024 03:05 PM   Modules accepted: Orders

## 2024-02-29 NOTE — Patient Instructions (Signed)
 Symbicort 160  up to Take 2 puffs first thing in am and then another 2 puffs about 12 hours later.   - take for a full week before try to taper down/off  Please schedule a follow up visit in 3 months but call sooner if needed

## 2024-02-29 NOTE — Progress Notes (Signed)
 Kaelem Brach, male    DOB: 1966/09/11    MRN: [id number]   Brief patient profile:  57  yobm from [facility]  minimal smoker  former  [facility] pt self-referred back to pulmonary clinic in [place]  02/29/2024  for recurrent asthma.      History of Present Illness  02/29/2024  Pulmonary/ 1st office eval/ Tevon Berhane / [place] Office  Chief Complaint  Patient presents with   Shortness of Breath  Dyspnea:  not as good with treadmill since ran out of BREO  Cough: minimal  dry with slot wheeze Sleep: flat/ 1 pillow s resp cc  SABA use: none  02 AOZ:HYQM     No obvious day to day or daytime pattern/variability or assoc excess/ purulent sputum or mucus plugs or hemoptysis or cp or chest tightness,  or overt sinus or hb symptoms.    Also denies any obvious fluctuation of symptoms with weather or environmental changes or other aggravating or alleviating factors except as outlined above   No unusual exposure hx or h/o childhood pna/ asthma or knowledge of premature birth.  Current Allergies, Complete Past Medical History, Past Surgical History, Family History, and Social History were reviewed in [employer] record.  ROS  The following are not active complaints unless bolded Hoarseness, sore throat, dysphagia, dental problems, itching, sneezing,  nasal congestion or discharge of excess mucus or purulent secretions, ear ache,   fever, chills, sweats, unintended wt loss or wt gain, classically pleuritic or exertional cp,  orthopnea pnd or arm/hand swelling  or leg swelling, presyncope, palpitations, abdominal pain, anorexia, nausea, vomiting, diarrhea  or change in bowel habits or change in bladder habits, change in stools or change in urine, dysuria, hematuria,  rash, arthralgias, visual complaints, headache, numbness, weakness or ataxia or problems with walking or coordination,  change in mood or  memory.            Outpatient Medications Prior to Visit  Medication Sig  Dispense Refill   esomeprazole  (NEXIUM ) 40 MG capsule Take 1 capsule (40 mg total) by mouth daily. Take 30 to 60 minutes before a meal 30 capsule 3   fluticasone  (FLONASE ) 50 MCG/ACT nasal spray Use 1 spray(s) in each nostril once daily 16 g 0   levocetirizine (XYZAL ) 5 MG tablet Take 1 tablet (5 mg total) by mouth every evening. 30 tablet 5   lidocaine  (XYLOCAINE ) 2 % solution Use as directed 15 mLs in the mouth or throat every 3 (three) hours as needed for mouth pain. Gargle and spit as needed for throat pain 100 mL 0   sildenafil  (VIAGRA ) 100 MG tablet Take 1 tablet (100 mg total) by mouth as needed for erectile dysfunction. 10 tablet 0   albuterol  (VENTOLIN  HFA) 108 (90 Base) MCG/ACT inhaler Inhale 1-2 puffs into the lungs every 4 (four) hours as needed for wheezing or shortness of breath. 18 g 1   fluticasone  furoate-vilanterol (BREO ELLIPTA ) 200-25 MCG/ACT AEPB Inhale 1 puff into the lungs daily. 60 each 5   No facility-administered medications prior to visit.    Past Medical History:  Diagnosis Date   Asthma    Family history of colon cancer    brother - 13s   GERD (gastroesophageal reflux disease)    Sciatica of left side       Objective:     BP (!) 142/88 (BP Location: Left Arm)   Pulse 71   Ht 5\' 8"  (1.727 m)   Wt 246 lb (  111.6 kg)   SpO2 96% Comment: RA  BMI 37.40 kg/m   SpO2: 96 % (RA) pleasant mod obese (by BMI) bm nad    HEENT : Oropharynx  clear      Nasal turbinates mod edema/ no pallor or polyps    NECK :  without  apparent JVD/ palpable Nodes/TM    LUNGS: no acc muscle use,  Nl contour chest  with scattered insp/exp wheeze  bilaterally without cough on insp or exp maneuvers   CV:  RRR  no s3 or murmur or increase in P2, and no edema   ABD:  soft and nontender   MS:  Gait nl   ext warm without deformities Or obvious joint restrictions  calf tenderness, cyanosis or clubbing    SKIN: warm and dry without lesions    NEURO:  alert, approp, nl  sensorium with  no motor or cerebellar deficits apparent.         Assessment   Mild persistent allergic asthma Onset in his 47s with intermittent symptoms - IgE  08/06/20 = 860 - 02/29/2024  After extensive coaching inhaler device,  effectiveness =    80% with hfa > start symbiocrt 160 x 3 m then regroup and consider the 80 2bid prn Based on two studies from NEJM  378; 20 p 1865 (2018) and 380 : p2020-30 (2019) in pts with mild asthma it is reasonable to use low dose symbicort eg 80 2bid "prn" flare in this setting but I emphasized this was only shown with symbicort and takes advantage of the rapid onset of action but is not the same as "rescue therapy" but can be stopped once the acute symptoms have resolved and the need for rescue has been minimized (< 2 x weekly)    >>>  His is actively wheezing with drop in ex tol so plan to get full control of airway inflammation and bronchospasm at full dose symbicort (breztri sample) = 160 2bid x one week then ok to taper if doing great  F/u 3 months - call sooner if needed      Each maintenance medication was reviewed in detail including emphasizing most importantly the difference between maintenance and prns and under what circumstances the prns are to be triggered using an action plan format where appropriate.  Total time for H and P, chart review, counseling, reviewing hfa  device(s) and generating customized AVS unique to this office visit / same day charting = 40 min with pt new to me           Vernestine Gondola, MD 02/29/2024

## 2024-05-30 NOTE — Progress Notes (Unsigned)
   Brett Guerrero, male    DOB: [birth year]    MRN: [id number]   Brief patient profile:  57  yobm from [facility]  minimal smoker  former  [facility] pt self-referred back to pulmonary clinic in [place]  02/29/2024  for recurrent asthma.      History of Present Illness  02/29/2024  Pulmonary/ 1st office eval/ Peggie Hornak / [place] Office  Chief Complaint  Patient presents with   Shortness of Breath  Dyspnea:  not as good with treadmill since ran out of BREO  Cough: minimal  dry with slot wheeze Sleep: flat/ 1 pillow s resp cc  SABA use: none  02 ldz:wnwz  Rec    05/31/2024  f/u ov/[place] office/Kollyns Mickelson re: asthma maint on ***  No chief complaint on file.   Dyspnea:  *** Cough: *** Sleeping: ***   resp cc  SABA use: *** 02: ***  Lung cancer screening: ***   No obvious day to day or daytime variability or assoc excess/ purulent sputum or mucus plugs or hemoptysis or cp or chest tightness, subjective wheeze or overt sinus or hb symptoms.    Also denies any obvious fluctuation of symptoms with weather or environmental changes or other aggravating or alleviating factors except as outlined above   No unusual exposure hx or h/o childhood pna/ asthma or knowledge of premature birth.  Current Allergies, Complete Past Medical History, Past Surgical History, Family History, and Social History were reviewed in [employer] record.  ROS  The following are not active complaints unless bolded Hoarseness, sore throat, dysphagia, dental problems, itching, sneezing,  nasal congestion or discharge of excess mucus or purulent secretions, ear ache,   fever, chills, sweats, unintended wt loss or wt gain, classically pleuritic or exertional cp,  orthopnea pnd or arm/hand swelling  or leg swelling, presyncope, palpitations, abdominal pain, anorexia, nausea, vomiting, diarrhea  or change in bowel habits or change in bladder habits, change in stools or change in urine, dysuria,  hematuria,  rash, arthralgias, visual complaints, headache, numbness, weakness or ataxia or problems with walking or coordination,  change in mood or  memory.        No outpatient medications have been marked as taking for the 05/31/24 encounter (Appointment) with Darlean Ozell NOVAK, MD.            Past Medical History:  Diagnosis Date   Asthma    Family history of colon cancer    brother - 35s   GERD (gastroesophageal reflux disease)    Sciatica of left side       Objective:      05/31/2024       ***   02/29/24 246 lb (111.6 kg)  06/29/23 249 lb 6 oz (113.1 kg)  06/24/22 266 lb 3.2 oz (120.7 kg)       Vital signs reviewed  05/31/2024  - Note at rest 02 sats  ***% on ***   General appearance:    ***   scattered insp/exp wheeze  bilaterally***            Assessment

## 2024-05-31 ENCOUNTER — Ambulatory Visit (INDEPENDENT_AMBULATORY_CARE_PROVIDER_SITE_OTHER): Admitting: Internal Medicine

## 2024-05-31 ENCOUNTER — Encounter: Payer: Self-pay | Admitting: Internal Medicine

## 2024-05-31 VITALS — BP 139/89 | HR 83 | Ht 68.0 in | Wt 255.4 lb

## 2024-05-31 DIAGNOSIS — J4530 Mild persistent asthma, uncomplicated: Secondary | ICD-10-CM | POA: Diagnosis not present

## 2024-05-31 MED ORDER — ALBUTEROL SULFATE HFA 108 (90 BASE) MCG/ACT IN AERS: INHALATION_SPRAY | RESPIRATORY_TRACT | 11 refills | Status: AC

## 2024-05-31 MED ORDER — ALBUTEROL SULFATE HFA 108 (90 BASE) MCG/ACT IN AERS: INHALATION_SPRAY | RESPIRATORY_TRACT | Status: DC

## 2024-05-31 NOTE — Assessment & Plan Note (Addendum)
 Onset in his 77s with intermittent symptoms - IgE  08/06/20 = 860 - 02/29/2024  After extensive coaching inhaler device,  effectiveness =    80% with hfa > start symbiocrt 160 x 3 m then regroup > much better 05/31/2024 so rec continue symb 160 2bid and add prn saba and refer to allergy   All goals of chronic asthma control met including optimal function and elimination of symptoms with minimal need for rescue therapy.  Contingencies discussed in full including contacting this office immediately if not controlling the symptoms using the rule of two's.     Next step is doing better controlling rhinitis > refer to allergy   Discussed access to meds issue > rec work with his insurance for the cheapest alternatives to symbicort  160 as already tried the Breyna  and still 160 / month.   F/u here q 6 m, call sooner if needed  Each maintenance medication was reviewed in detail including emphasizing most importantly the difference between maintenance and prns and under what circumstances the prns are to be triggered using an action plan format where appropriate.  Total time for H and P, chart review, counseling, reviewing hfa device(s) and generating customized AVS unique to this office visit / same day charting = 35 min

## 2024-05-31 NOTE — Patient Instructions (Signed)
 Plan A = Automatic = Always=    Symbicort  160 Take 2 puffs first thing in am and then another 2 puffs about 12 hours later.    Plan B = Backup (to supplement plan A, not to replace it) Use your albuterol  inhaler as a rescue medication to be used if you can't catch your breath by resting or slowing your pace  or doing a relaxed purse lip breathing pattern.  - The less you use it, the better it will work when you need it. - Ok to use the inhaler up to 2 puffs  every 4 hours if you must but call for appointment if use goes up over your usual need - Don't leave home without it !!  (think of it like the spare tire or starter fluid for your car)   My office will be contacting you by phone for referral to allergy   - if you don't hear back from my office within one week please call us  back or notify us  thru MyChart and we'll address it right away.    Please schedule a follow up visit in 6  months but call sooner if needed

## 2024-06-11 ENCOUNTER — Telehealth: Payer: Self-pay

## 2024-06-11 ENCOUNTER — Other Ambulatory Visit: Payer: Self-pay

## 2024-06-11 MED ORDER — BUDESONIDE-FORMOTEROL FUMARATE 160-4.5 MCG/ACT IN AERO: 2.0000 | INHALATION_SPRAY | Freq: Two times a day (BID) | RESPIRATORY_TRACT | 5 refills | Status: AC

## 2024-06-11 NOTE — Telephone Encounter (Signed)
[facility]

## 2024-06-11 NOTE — Telephone Encounter (Signed)
 Copied from CRM [id number]. Topic: Clinical - Prescription Issue >> Jun 11, 2024  3:16 PM Brett Guerrero wrote: Reason for CRM: Patient is requesting Dr. Darlean [facility] for Breyna  [facility], [facility].    From LOV : Discussed access to meds issue > rec work with his insurance for the cheapest alternatives to symbicort  160 as already tried the Breyna  and still 160 / month.  Is this okay for me to fill?

## 2024-06-19 ENCOUNTER — Ambulatory Visit: Payer: Self-pay

## 2024-07-01 ENCOUNTER — Ambulatory Visit: Payer: Self-pay | Admitting: Emergency Medicine

## 2024-07-01 ENCOUNTER — Ambulatory Visit: Payer: 59 | Admitting: Emergency Medicine

## 2024-07-01 ENCOUNTER — Encounter: Payer: Self-pay | Admitting: Emergency Medicine

## 2024-07-01 VITALS — BP 134/82 | HR 72 | Temp 97.6°F | Ht 68.0 in | Wt 255.0 lb

## 2024-07-01 DIAGNOSIS — Z23 Encounter for immunization: Secondary | ICD-10-CM | POA: Diagnosis not present

## 2024-07-01 DIAGNOSIS — Z1329 Encounter for screening for other suspected endocrine disorder: Secondary | ICD-10-CM

## 2024-07-01 DIAGNOSIS — Z13220 Encounter for screening for lipoid disorders: Secondary | ICD-10-CM | POA: Diagnosis not present

## 2024-07-01 DIAGNOSIS — Z13228 Encounter for screening for other metabolic disorders: Secondary | ICD-10-CM | POA: Diagnosis not present

## 2024-07-01 DIAGNOSIS — Z0000 Encounter for general adult medical examination without abnormal findings: Secondary | ICD-10-CM | POA: Diagnosis not present

## 2024-07-01 DIAGNOSIS — Z125 Encounter for screening for malignant neoplasm of prostate: Secondary | ICD-10-CM | POA: Diagnosis not present

## 2024-07-01 DIAGNOSIS — Z130 Encounter for screening for diseases of the blood and blood-forming organs and certain disorders involving the immune mechanism: Secondary | ICD-10-CM | POA: Diagnosis not present

## 2024-07-01 LAB — LIPID PANEL
Cholesterol: 120 mg/dL (ref 0–200)
HDL: 36.2 mg/dL — ABNORMAL LOW (ref >39.00)
LDL Cholesterol: 72 mg/dL (ref 0–99)
NonHDL: 83.61
Total CHOL/HDL Ratio: 3
Triglycerides: 59 mg/dL (ref 0.0–149.0)
VLDL: 11.8 mg/dL (ref 0.0–40.0)

## 2024-07-01 LAB — CBC WITH DIFFERENTIAL/PLATELET
Basophils Absolute: 0 K/uL (ref 0.0–0.1)
Basophils Relative: 0.2 % (ref 0.0–3.0)
Eosinophils Absolute: 0.2 K/uL (ref 0.0–0.7)
Eosinophils Relative: 5.1 % — ABNORMAL HIGH (ref 0.0–5.0)
HCT: 41.6 % (ref 39.0–52.0)
Hemoglobin: 13.9 g/dL (ref 13.0–17.0)
Lymphocytes Relative: 55 % — ABNORMAL HIGH (ref 12.0–46.0)
Lymphs Abs: 2.5 K/uL (ref 0.7–4.0)
MCHC: 33.5 g/dL (ref 30.0–36.0)
MCV: 89.1 fl (ref 78.0–100.0)
Monocytes Absolute: 0.4 K/uL (ref 0.1–1.0)
Monocytes Relative: 8.7 % (ref 3.0–12.0)
Neutro Abs: 1.4 K/uL (ref 1.4–7.7)
Neutrophils Relative %: 31 % — ABNORMAL LOW (ref 43.0–77.0)
Platelets: 179 K/uL (ref 150.0–400.0)
RBC: 4.67 Mil/uL (ref 4.22–5.81)
RDW: 12.8 % (ref 11.5–15.5)
WBC: 4.6 K/uL (ref 4.0–10.5)

## 2024-07-01 LAB — COMPREHENSIVE METABOLIC PANEL WITH GFR
ALT: 11 U/L (ref 0–53)
AST: 12 U/L (ref 0–37)
Albumin: 3.7 g/dL (ref 3.5–5.2)
Alkaline Phosphatase: 68 U/L (ref 39–117)
BUN: 9 mg/dL (ref 6–23)
CO2: 30 meq/L (ref 19–32)
Calcium: 9.1 mg/dL (ref 8.4–10.5)
Chloride: 104 meq/L (ref 96–112)
Creatinine, Ser: 0.75 mg/dL (ref 0.40–1.50)
GFR: 100.71 mL/min (ref >60.00)
Glucose, Bld: 102 mg/dL — ABNORMAL HIGH (ref 70–99)
Potassium: 3.7 meq/L (ref 3.5–5.1)
Sodium: 139 meq/L (ref 135–145)
Total Bilirubin: 0.8 mg/dL (ref 0.2–1.2)
Total Protein: 7.3 g/dL (ref 6.0–8.3)

## 2024-07-01 LAB — PSA: PSA: 1.58 ng/mL (ref 0.10–4.00)

## 2024-07-01 LAB — HEMOGLOBIN A1C: Hgb A1c MFr Bld: 6.3 % (ref 4.6–6.5)

## 2024-07-01 NOTE — Patient Instructions (Signed)
 Health Maintenance, Male  Adopting a healthy lifestyle and getting preventive care are important in promoting health and wellness. Ask your health care provider about:  The right schedule for you to have regular tests and exams.  Things you can do on your own to prevent diseases and keep yourself healthy.  What should I know about diet, weight, and exercise?  Eat a healthy diet    Eat a diet that includes plenty of vegetables, fruits, low-fat dairy products, and lean protein.  Do not eat a lot of foods that are high in solid fats, added sugars, or sodium.  Maintain a healthy weight  Body mass index (BMI) is a measurement that can be used to identify possible weight problems. It estimates body fat based on height and weight. Your health care provider can help determine your BMI and help you achieve or maintain a healthy weight.  Get regular exercise  Get regular exercise. This is one of the most important things you can do for your health. Most adults should:  Exercise for at least 150 minutes each week. The exercise should increase your heart rate and make you sweat (moderate-intensity exercise).  Do strengthening exercises at least twice a week. This is in addition to the moderate-intensity exercise.  Spend less time sitting. Even light physical activity can be beneficial.  Watch cholesterol and blood lipids  Have your blood tested for lipids and cholesterol at 57 years of age, then have this test every 5 years.  You may need to have your cholesterol levels checked more often if:  Your lipid or cholesterol levels are high.  You are older than 57 years of age.  You are at high risk for heart disease.  What should I know about cancer screening?  Many types of cancers can be detected early and may often be prevented. Depending on your health history and family history, you may need to have cancer screening at various ages. This may include screening for:  Colorectal cancer.  Prostate cancer.  Skin cancer.  Lung  cancer.  What should I know about heart disease, diabetes, and high blood pressure?  Blood pressure and heart disease  High blood pressure causes heart disease and increases the risk of stroke. This is more likely to develop in people who have high blood pressure readings or are overweight.  Talk with your health care provider about your target blood pressure readings.  Have your blood pressure checked:  Every 3-5 years if you are 24-52 years of age.  Every year if you are 3 years old or older.  If you are between the ages of 60 and 72 and are a current or former smoker, ask your health care provider if you should have a one-time screening for abdominal aortic aneurysm (AAA).  Diabetes  Have regular diabetes screenings. This checks your fasting blood sugar level. Have the screening done:  Once every three years after age 66 if you are at a normal weight and have a low risk for diabetes.  More often and at a younger age if you are overweight or have a high risk for diabetes.  What should I know about preventing infection?  Hepatitis B  If you have a higher risk for hepatitis B, you should be screened for this virus. Talk with your health care provider to find out if you are at risk for hepatitis B infection.  Hepatitis C  Blood testing is recommended for:  Everyone born from [birth year] through 1964.  Anyone  with known risk factors for hepatitis C.  Sexually transmitted infections (STIs)  You should be screened each year for STIs, including gonorrhea and chlamydia, if:  You are sexually active and are younger than 57 years of age.  You are older than 57 years of age and your health care provider tells you that you are at risk for this type of infection.  Your sexual activity has changed since you were last screened, and you are at increased risk for chlamydia or gonorrhea. Ask your health care provider if you are at risk.  Ask your health care provider about whether you are at high risk for HIV. Your health care provider  may recommend a prescription medicine to help prevent HIV infection. If you choose to take medicine to prevent HIV, you should first get tested for HIV. You should then be tested every 3 months for as long as you are taking the medicine.  Follow these instructions at home:  Alcohol use  Do not drink alcohol if your health care provider tells you not to drink.  If you drink alcohol:  Limit how much you have to 0-2 drinks a day.  Know how much alcohol is in your drink. In the [place] one drink equals one 12 oz bottle of beer (355 mL), one 5 oz glass of wine (148 mL), or one 1 oz glass of hard liquor (44 mL).  Lifestyle  Do not use any products that contain nicotine or tobacco. These products include cigarettes, chewing tobacco, and vaping devices, such as e-cigarettes. If you need help quitting, ask your health care provider.  Do not use street drugs.  Do not share needles.  Ask your health care provider for help if you need support or information about quitting drugs.  General instructions  Schedule regular health, dental, and eye exams.  Stay current with your vaccines.  Tell your health care provider if:  You often feel depressed.  You have ever been abused or do not feel safe at home.  Summary  Adopting a healthy lifestyle and getting preventive care are important in promoting health and wellness.  Follow your health care provider's instructions about healthy diet, exercising, and getting tested or screened for diseases.  Follow your health care provider's instructions on monitoring your cholesterol and blood pressure.  This information is not intended to replace advice given to you by your health care provider. Make sure you discuss any questions you have with your health care provider.  Document Revised: 02/08/2021 Document Reviewed: 02/08/2021  Elsevier Patient Education  2024 [employer].

## 2024-07-01 NOTE — Progress Notes (Addendum)
 Brett Guerrero 58 y.o.   Chief Complaint  Patient presents with  . Annual Exam    fasting    HISTORY OF PRESENT ILLNESS: This is a 58 y.o. male here for annual exam. Overall doing well.  Chronic allergies. Has no other complaints or medical concerns today.  HPI   Prior to Admission medications   Medication Sig Start Date End Date Taking? Authorizing Provider  albuterol  (PROAIR  HFA) 108 (90 Base) MCG/ACT inhaler 2 puffs every 4 hours as needed only  if your can't catch your breath 05/31/24  Yes Darlean Ozell NOVAK, MD  budesonide -formoterol  (BREYNA ) 160-4.5 MCG/ACT inhaler Inhale 2 puffs into the lungs 2 (two) times daily. Take 2 puffs first thing in am and then another 2 puffs about 12 hours later. 06/11/24  Yes Darlean Ozell NOVAK, MD  budesonide -formoterol  (SYMBICORT ) 160-4.5 MCG/ACT inhaler Take 2 puffs first thing in am and then another 2 puffs about 12 hours later. 02/29/24  Yes Darlean Ozell NOVAK, MD  esomeprazole  (NEXIUM ) 40 MG capsule Take 1 capsule (40 mg total) by mouth daily. Take 30 to 60 minutes before a meal 03/01/22  Yes Vaishnav Demartin, Emil Schanz, MD  fluticasone  (FLONASE ) 50 MCG/ACT nasal spray Use 1 spray(s) in each nostril once daily 02/19/24  Yes Marjan Rosman Jose, MD  levocetirizine (XYZAL ) 5 MG tablet Take 1 tablet (5 mg total) by mouth every evening. 12/16/22  Yes Cobb, Comer GAILS, NP  lidocaine  (XYLOCAINE ) 2 % solution Use as directed 15 mLs in the mouth or throat every 3 (three) hours as needed for mouth pain. Gargle and spit as needed for throat pain 01/13/23  Yes Chandra Raisin A, NP  sildenafil  (VIAGRA ) 100 MG tablet Take 1 tablet (100 mg total) by mouth as needed for erectile dysfunction. 12/14/23  Yes Shahin Knierim, Emil Schanz, MD  promethazine  (PHENERGAN ) 25 MG tablet Take 1 tablet (25 mg total) by mouth every 8 (eight) hours as needed for nausea (headache). Patient not taking: Reported on 10/11/2018 05/07/17 10/22/20  Dicky Anes, MD    No Known Allergies  Patient Active Problem  List   Diagnosis Date Noted  . History of elevated PSA 06/29/2023  . Mild persistent allergic asthma 06/24/2022  . Allergic rhinitis 06/24/2022  . Elevated PSA 03/07/2022  . Family history of prostate cancer 03/07/2022  . Erectile dysfunction 03/07/2022  . Chronic bronchitis with wheezing (HCC) 05/01/2020    Past Medical History:  Diagnosis Date  . Asthma   . Family history of colon cancer    brother - 41s  . GERD (gastroesophageal reflux disease)   . Sciatica of left side     No past surgical history on file.  Social History   Socioeconomic History  . Marital status: Married    Spouse name: Not on file  . Number of children: Not on file  . Years of education: Not on file  . Highest education level: Not on file  Occupational History  . Not on file  Tobacco Use  . Smoking status: Never  . Smokeless tobacco: Never  Vaping Use  . Vaping status: Never Used  Substance and Sexual Activity  . Alcohol use: No  . Drug use: No  . Sexual activity: Not on file  Other Topics Concern  . Not on file  Social History Narrative  . Not on file   Social Drivers of Health   Financial Resource Strain: Not on file  Food Insecurity: Not on file  Transportation Needs: Not on file  Physical Activity: Not  on file  Stress: Not on file  Social Connections: Not on file  Intimate Partner Violence: Not on file    Family History  Problem Relation Age of Onset  . Cancer Mother   . Diabetes Mother   . Hyperlipidemia Father   . Colon cancer Brother 49  . Esophageal cancer Neg Hx   . Stomach cancer Neg Hx   . Rectal cancer Neg Hx      Review of Systems  Constitutional: Negative.  Negative for chills and fever.  HENT: Negative.  Negative for congestion and sore throat.   Respiratory: Negative.  Negative for cough and shortness of breath.   Cardiovascular: Negative.  Negative for chest pain and palpitations.  Gastrointestinal:  Negative for abdominal pain, diarrhea, nausea and  vomiting.  Genitourinary: Negative.  Negative for dysuria and hematuria.  Skin: Negative.  Negative for rash.  Neurological: Negative.  Negative for dizziness and headaches.  Endo/Heme/Allergies:  Positive for environmental allergies.  All other systems reviewed and are negative.   Vitals:   07/01/24 0801  BP: 134/82  Pulse: 72  Temp: 97.6 F (36.4 C)  SpO2: 99%    Physical Exam Vitals reviewed.  Constitutional:      Appearance: Normal appearance.  HENT:     Head: Normocephalic.     Right Ear: Tympanic membrane, ear canal and external ear normal.     Left Ear: Tympanic membrane, ear canal and external ear normal.     Mouth/Throat:     Mouth: Mucous membranes are moist.     Pharynx: Oropharynx is clear.  Eyes:     Extraocular Movements: Extraocular movements intact.     Pupils: Pupils are equal, round, and reactive to light.  Cardiovascular:     Rate and Rhythm: Normal rate and regular rhythm.     Pulses: Normal pulses.     Heart sounds: Normal heart sounds.  Pulmonary:     Effort: Pulmonary effort is normal.     Breath sounds: Normal breath sounds.  Abdominal:     Palpations: Abdomen is soft.     Tenderness: There is no abdominal tenderness.     Hernia: A hernia (Umbilical) is present.  Musculoskeletal:     Cervical back: No tenderness.  Lymphadenopathy:     Cervical: No cervical adenopathy.  Skin:    General: Skin is warm and dry.     Capillary Refill: Capillary refill takes less than 2 seconds.  Neurological:     General: No focal deficit present.     Mental Status: He is alert and oriented to person, place, and time.  Psychiatric:        Mood and Affect: Mood normal.        Behavior: Behavior normal.      ASSESSMENT & PLAN: Problem List Items Addressed This Visit   None Visit Diagnoses       Routine general medical examination at a health care facility    -  Primary   Relevant Orders   PSA   CBC with Differential/Platelet   Comprehensive  metabolic panel with GFR   Hemoglobin A1c   Lipid panel     Need for influenza vaccination       Relevant Orders   Flu vaccine trivalent PF, 6mos and older(Flulaval,Afluria,Fluarix,Fluzone)     Screening for deficiency anemia       Relevant Orders   CBC with Differential/Platelet     Screening for lipoid disorders       Relevant Orders  Lipid panel     Screening for endocrine, metabolic and immunity disorder       Relevant Orders   Comprehensive metabolic panel with GFR   Hemoglobin A1c     Screening for prostate cancer       Relevant Orders   PSA      Modifiable risk factors discussed with patient. Anticipatory guidance according to age provided. The following topics were also discussed: Social Determinants of Health Smoking.  Non-smoker Diet and nutrition Benefits of exercise Cancer screening and review of most recent colonoscopy report from 2022 Vaccinations review and recommendations Cardiovascular risk assessment and need for blood work Mental health including depression and anxiety Fall and accident prevention  Patient Instructions  Health Maintenance, Male Adopting a healthy lifestyle and getting preventive care are important in promoting health and wellness. Ask your health care provider about: The right schedule for you to have regular tests and exams. Things you can do on your own to prevent diseases and keep yourself healthy. What should I know about diet, weight, and exercise? Eat a healthy diet  Eat a diet that includes plenty of vegetables, fruits, low-fat dairy products, and lean protein. Do not eat a lot of foods that are high in solid fats, added sugars, or sodium. Maintain a healthy weight Body mass index (BMI) is a measurement that can be used to identify possible weight problems. It estimates body fat based on height and weight. Your health care provider can help determine your BMI and help you achieve or maintain a healthy weight. Get regular  exercise Get regular exercise. This is one of the most important things you can do for your health. Most adults should: Exercise for at least 150 minutes each week. The exercise should increase your heart rate and make you sweat (moderate-intensity exercise). Do strengthening exercises at least twice a week. This is in addition to the moderate-intensity exercise. Spend less time sitting. Even light physical activity can be beneficial. Watch cholesterol and blood lipids Have your blood tested for lipids and cholesterol at 57 years of age, then have this test every 5 years. You may need to have your cholesterol levels checked more often if: Your lipid or cholesterol levels are high. You are older than 57 years of age. You are at high risk for heart disease. What should I know about cancer screening? Many types of cancers can be detected early and may often be prevented. Depending on your health history and family history, you may need to have cancer screening at various ages. This may include screening for: Colorectal cancer. Prostate cancer. Skin cancer. Lung cancer. What should I know about heart disease, diabetes, and high blood pressure? Blood pressure and heart disease High blood pressure causes heart disease and increases the risk of stroke. This is more likely to develop in people who have high blood pressure readings or are overweight. Talk with your health care provider about your target blood pressure readings. Have your blood pressure checked: Every 3-5 years if you are 76-25 years of age. Every year if you are 43 years old or older. If you are between the ages of 78 and 69 and are a current or former smoker, ask your health care provider if you should have a one-time screening for abdominal aortic aneurysm (AAA). Diabetes Have regular diabetes screenings. This checks your fasting blood sugar level. Have the screening done: Once every three years after age 20 if you are at a  normal weight and have a  low risk for diabetes. More often and at a younger age if you are overweight or have a high risk for diabetes. What should I know about preventing infection? Hepatitis B If you have a higher risk for hepatitis B, you should be screened for this virus. Talk with your health care provider to find out if you are at risk for hepatitis B infection. Hepatitis C Blood testing is recommended for: Everyone born from [birth year] through 1964. Anyone with known risk factors for hepatitis C. Sexually transmitted infections (STIs) You should be screened each year for STIs, including gonorrhea and chlamydia, if: You are sexually active and are younger than 56 years of age. You are older than 57 years of age and your health care provider tells you that you are at risk for this type of infection. Your sexual activity has changed since you were last screened, and you are at increased risk for chlamydia or gonorrhea. Ask your health care provider if you are at risk. Ask your health care provider about whether you are at high risk for HIV. Your health care provider may recommend a prescription medicine to help prevent HIV infection. If you choose to take medicine to prevent HIV, you should first get tested for HIV. You should then be tested every 3 months for as long as you are taking the medicine. Follow these instructions at home: Alcohol use Do not drink alcohol if your health care provider tells you not to drink. If you drink alcohol: Limit how much you have to 0-2 drinks a day. Know how much alcohol is in your drink. In the [place] one drink equals one 12 oz bottle of beer (355 mL), one 5 oz glass of wine (148 mL), or one 1 oz glass of hard liquor (44 mL). Lifestyle Do not use any products that contain nicotine or tobacco. These products include cigarettes, chewing tobacco, and vaping devices, such as e-cigarettes. If you need help quitting, ask your health care provider. Do not use street  drugs. Do not share needles. Ask your health care provider for help if you need support or information about quitting drugs. General instructions Schedule regular health, dental, and eye exams. Stay current with your vaccines. Tell your health care provider if: You often feel depressed. You have ever been abused or do not feel safe at home. Summary Adopting a healthy lifestyle and getting preventive care are important in promoting health and wellness. Follow your health care provider's instructions about healthy diet, exercising, and getting tested or screened for diseases. Follow your health care provider's instructions on monitoring your cholesterol and blood pressure. This information is not intended to replace advice given to you by your health care provider. Make sure you discuss any questions you have with your health care provider. Document Revised: 02/08/2021 Document Reviewed: 02/08/2021 Elsevier Patient Education  2024 [employer]      Emil Schaumann, MD [facility]

## 2024-07-31 ENCOUNTER — Ambulatory Visit: Admitting: Allergy & Immunology

## 2024-08-06 ENCOUNTER — Encounter: Payer: Self-pay | Admitting: Emergency Medicine

## 2024-09-19 ENCOUNTER — Other Ambulatory Visit: Payer: Self-pay | Admitting: Emergency Medicine

## 2024-10-09 ENCOUNTER — Encounter: Payer: Self-pay | Admitting: Internal Medicine
